# Patient Record
Sex: Female | Born: 1940 | Race: Black or African American | Hispanic: No
Health system: Southern US, Community
[De-identification: ages and names within clinical notes are randomized; demographics above are authoritative.]

## PROBLEM LIST (undated history)

## (undated) DIAGNOSIS — C801 Malignant (primary) neoplasm, unspecified: Secondary | ICD-10-CM

## (undated) DIAGNOSIS — K219 Gastro-esophageal reflux disease without esophagitis: Secondary | ICD-10-CM

## (undated) DIAGNOSIS — E7800 Pure hypercholesterolemia, unspecified: Secondary | ICD-10-CM

## (undated) DIAGNOSIS — K5730 Diverticulosis of large intestine without perforation or abscess without bleeding: Secondary | ICD-10-CM

## (undated) DIAGNOSIS — E119 Type 2 diabetes mellitus without complications: Secondary | ICD-10-CM

## (undated) DIAGNOSIS — H409 Unspecified glaucoma: Secondary | ICD-10-CM

## (undated) DIAGNOSIS — K859 Acute pancreatitis without necrosis or infection, unspecified: Secondary | ICD-10-CM

---

## 1998-04-11 ENCOUNTER — Emergency Department (HOSPITAL_COMMUNITY): Admission: EM | Admit: 1998-04-11 | Discharge: 1998-04-11 | Payer: Self-pay | Admitting: Emergency Medicine

## 1998-04-11 ENCOUNTER — Encounter: Payer: Self-pay | Admitting: Emergency Medicine

## 1998-05-02 ENCOUNTER — Encounter: Admission: RE | Admit: 1998-05-02 | Discharge: 1998-05-02 | Payer: Self-pay | Admitting: Internal Medicine

## 1998-10-13 ENCOUNTER — Encounter: Admission: RE | Admit: 1998-10-13 | Discharge: 1998-10-13 | Payer: Self-pay | Admitting: Internal Medicine

## 1999-03-20 ENCOUNTER — Emergency Department (HOSPITAL_COMMUNITY): Admission: EM | Admit: 1999-03-20 | Discharge: 1999-03-20 | Payer: Self-pay | Admitting: Emergency Medicine

## 1999-03-23 ENCOUNTER — Encounter: Admission: RE | Admit: 1999-03-23 | Discharge: 1999-03-23 | Payer: Self-pay | Admitting: Internal Medicine

## 1999-12-31 ENCOUNTER — Other Ambulatory Visit: Admission: RE | Admit: 1999-12-31 | Discharge: 1999-12-31 | Payer: Self-pay | Admitting: Family Medicine

## 2000-02-27 ENCOUNTER — Other Ambulatory Visit: Admission: RE | Admit: 2000-02-27 | Discharge: 2000-02-27 | Payer: Self-pay | Admitting: Radiology

## 2000-03-05 ENCOUNTER — Encounter (INDEPENDENT_AMBULATORY_CARE_PROVIDER_SITE_OTHER): Payer: Self-pay | Admitting: Specialist

## 2000-03-05 ENCOUNTER — Other Ambulatory Visit: Admission: RE | Admit: 2000-03-05 | Discharge: 2000-03-05 | Payer: Self-pay | Admitting: Radiology

## 2000-03-27 ENCOUNTER — Other Ambulatory Visit: Admission: RE | Admit: 2000-03-27 | Discharge: 2000-03-27 | Payer: Self-pay | Admitting: Surgery

## 2000-03-27 ENCOUNTER — Encounter (INDEPENDENT_AMBULATORY_CARE_PROVIDER_SITE_OTHER): Payer: Self-pay | Admitting: Specialist

## 2000-04-03 ENCOUNTER — Encounter: Admission: RE | Admit: 2000-04-03 | Discharge: 2000-07-02 | Payer: Self-pay | Admitting: Radiation Oncology

## 2001-04-08 ENCOUNTER — Other Ambulatory Visit: Admission: RE | Admit: 2001-04-08 | Discharge: 2001-04-08 | Payer: Self-pay | Admitting: Family Medicine

## 2006-10-26 ENCOUNTER — Emergency Department (HOSPITAL_COMMUNITY): Admission: EM | Admit: 2006-10-26 | Discharge: 2006-10-26 | Payer: Self-pay | Admitting: *Deleted

## 2008-06-28 ENCOUNTER — Ambulatory Visit: Payer: Self-pay | Admitting: Cardiovascular Disease

## 2008-06-28 ENCOUNTER — Ambulatory Visit: Payer: Self-pay | Admitting: Emergency Medicine

## 2008-06-28 ENCOUNTER — Inpatient Hospital Stay (HOSPITAL_COMMUNITY): Admission: EM | Admit: 2008-06-28 | Discharge: 2008-07-25 | Payer: Self-pay | Admitting: Emergency Medicine

## 2008-07-01 ENCOUNTER — Ambulatory Visit: Payer: Self-pay | Admitting: Gastroenterology

## 2008-07-05 ENCOUNTER — Encounter: Payer: Self-pay | Admitting: Pulmonary Disease

## 2008-07-09 ENCOUNTER — Encounter (INDEPENDENT_AMBULATORY_CARE_PROVIDER_SITE_OTHER): Payer: Self-pay | Admitting: Internal Medicine

## 2008-07-11 ENCOUNTER — Encounter (INDEPENDENT_AMBULATORY_CARE_PROVIDER_SITE_OTHER): Payer: Self-pay | Admitting: Internal Medicine

## 2008-07-11 ENCOUNTER — Ambulatory Visit: Payer: Self-pay | Admitting: Vascular Surgery

## 2008-07-21 ENCOUNTER — Ambulatory Visit: Payer: Self-pay | Admitting: Physical Medicine & Rehabilitation

## 2008-08-15 ENCOUNTER — Emergency Department (HOSPITAL_COMMUNITY): Admission: EM | Admit: 2008-08-15 | Discharge: 2008-08-15 | Payer: Self-pay | Admitting: Emergency Medicine

## 2008-08-28 ENCOUNTER — Inpatient Hospital Stay (HOSPITAL_COMMUNITY): Admission: EM | Admit: 2008-08-28 | Discharge: 2008-08-31 | Payer: Self-pay | Admitting: Emergency Medicine

## 2008-09-13 ENCOUNTER — Inpatient Hospital Stay (HOSPITAL_COMMUNITY): Admission: RE | Admit: 2008-09-13 | Discharge: 2008-09-16 | Payer: Self-pay | Admitting: General Surgery

## 2008-09-13 ENCOUNTER — Encounter (INDEPENDENT_AMBULATORY_CARE_PROVIDER_SITE_OTHER): Payer: Self-pay | Admitting: General Surgery

## 2008-09-26 ENCOUNTER — Encounter: Payer: Self-pay | Admitting: Gastroenterology

## 2008-10-02 ENCOUNTER — Inpatient Hospital Stay (HOSPITAL_COMMUNITY): Admission: EM | Admit: 2008-10-02 | Discharge: 2008-10-07 | Payer: Self-pay | Admitting: Emergency Medicine

## 2010-05-11 LAB — GLUCOSE, CAPILLARY
Glucose-Capillary: 122 mg/dL — ABNORMAL HIGH (ref 70–99)
Glucose-Capillary: 132 mg/dL — ABNORMAL HIGH (ref 70–99)
Glucose-Capillary: 134 mg/dL — ABNORMAL HIGH (ref 70–99)

## 2010-05-12 LAB — URINALYSIS, ROUTINE W REFLEX MICROSCOPIC
Leukocytes, UA: NEGATIVE
Nitrite: NEGATIVE
Specific Gravity, Urine: 1.012 (ref 1.005–1.030)
Urobilinogen, UA: 0.2 mg/dL (ref 0.0–1.0)
pH: 7 (ref 5.0–8.0)

## 2010-05-12 LAB — MAGNESIUM: Magnesium: 1.6 mg/dL (ref 1.5–2.5)

## 2010-05-12 LAB — COMPREHENSIVE METABOLIC PANEL
ALT: 13 U/L (ref 0–35)
AST: 20 U/L (ref 0–37)
Albumin: 2.8 g/dL — ABNORMAL LOW (ref 3.5–5.2)
Albumin: 4.1 g/dL (ref 3.5–5.2)
Alkaline Phosphatase: 53 U/L (ref 39–117)
BUN: 1 mg/dL — ABNORMAL LOW (ref 6–23)
BUN: 2 mg/dL — ABNORMAL LOW (ref 6–23)
BUN: 5 mg/dL — ABNORMAL LOW (ref 6–23)
CO2: 27 mEq/L (ref 19–32)
CO2: 31 mEq/L (ref 19–32)
Calcium: 8 mg/dL — ABNORMAL LOW (ref 8.4–10.5)
Calcium: 8.8 mg/dL (ref 8.4–10.5)
Calcium: 9.5 mg/dL (ref 8.4–10.5)
Chloride: 100 mEq/L (ref 96–112)
Chloride: 106 mEq/L (ref 96–112)
Creatinine, Ser: 0.57 mg/dL (ref 0.4–1.2)
Creatinine, Ser: 0.58 mg/dL (ref 0.4–1.2)
Creatinine, Ser: 0.59 mg/dL (ref 0.4–1.2)
GFR calc Af Amer: >60 mL/min (ref >60)
GFR calc Af Amer: >60 mL/min (ref >60)
GFR calc non Af Amer: >60 mL/min (ref >60)
GFR calc non Af Amer: >60 mL/min (ref >60)
GFR calc non Af Amer: >60 mL/min (ref >60)
Glucose, Bld: 103 mg/dL — ABNORMAL HIGH (ref 70–99)
Glucose, Bld: 105 mg/dL — ABNORMAL HIGH (ref 70–99)
Potassium: 3.5 mEq/L (ref 3.5–5.1)
Sodium: 144 mEq/L (ref 135–145)
Total Bilirubin: 0.3 mg/dL (ref 0.3–1.2)
Total Bilirubin: 0.7 mg/dL (ref 0.3–1.2)
Total Protein: 5.7 g/dL — ABNORMAL LOW (ref 6.0–8.3)
Total Protein: 5.9 g/dL — ABNORMAL LOW (ref 6.0–8.3)
Total Protein: 7.4 g/dL (ref 6.0–8.3)

## 2010-05-12 LAB — BASIC METABOLIC PANEL
BUN: 2 mg/dL — ABNORMAL LOW (ref 6–23)
BUN: 3 mg/dL — ABNORMAL LOW (ref 6–23)
Calcium: 8.4 mg/dL (ref 8.4–10.5)
Calcium: 8.6 mg/dL (ref 8.4–10.5)
Chloride: 107 mEq/L (ref 96–112)
Creatinine, Ser: 0.53 mg/dL (ref 0.4–1.2)
GFR calc Af Amer: >60 mL/min (ref >60)
GFR calc non Af Amer: >60 mL/min (ref >60)
GFR calc non Af Amer: >60 mL/min (ref >60)
Potassium: 3.4 mEq/L — ABNORMAL LOW (ref 3.5–5.1)

## 2010-05-12 LAB — CBC
HCT: 29.4 % — ABNORMAL LOW (ref 36.0–46.0)
HCT: 34.5 % — ABNORMAL LOW (ref 36.0–46.0)
HCT: 40 % (ref 36.0–46.0)
Hemoglobin: 10.4 g/dL — ABNORMAL LOW (ref 12.0–15.0)
Hemoglobin: 11.5 g/dL — ABNORMAL LOW (ref 12.0–15.0)
MCHC: 32.6 g/dL (ref 30.0–36.0)
MCHC: 33.2 g/dL (ref 30.0–36.0)
MCHC: 33.2 g/dL (ref 30.0–36.0)
MCV: 93 fL (ref 78.0–100.0)
MCV: 93 fL (ref 78.0–100.0)
MCV: 93 fL (ref 78.0–100.0)
Platelets: 198 10*3/uL (ref 150–400)
Platelets: 221 10*3/uL (ref 150–400)
Platelets: 228 10*3/uL (ref 150–400)
Platelets: 296 10*3/uL (ref 150–400)
RBC: 3.05 MIL/uL — ABNORMAL LOW (ref 3.87–5.11)
RBC: 3.45 MIL/uL — ABNORMAL LOW (ref 3.87–5.11)
RBC: 3.73 MIL/uL — ABNORMAL LOW (ref 3.87–5.11)
RDW: 14 % (ref 11.5–15.5)
RDW: 14.3 % (ref 11.5–15.5)
WBC: 5.2 10*3/uL (ref 4.0–10.5)
WBC: 5.8 10*3/uL (ref 4.0–10.5)
WBC: 6.8 10*3/uL (ref 4.0–10.5)
WBC: 7.9 10*3/uL (ref 4.0–10.5)

## 2010-05-12 LAB — GLUCOSE, CAPILLARY
Glucose-Capillary: 131 mg/dL — ABNORMAL HIGH (ref 70–99)
Glucose-Capillary: 73 mg/dL (ref 70–99)
Glucose-Capillary: 78 mg/dL (ref 70–99)

## 2010-05-12 LAB — DIFFERENTIAL
Basophils Absolute: 0 10*3/uL (ref 0.0–0.1)
Basophils Absolute: 0 10*3/uL (ref 0.0–0.1)
Basophils Relative: 0 % (ref 0–1)
Eosinophils Absolute: 0.1 10*3/uL (ref 0.0–0.7)
Lymphocytes Relative: 12 % (ref 12–46)
Lymphs Abs: 1 10*3/uL (ref 0.7–4.0)
Monocytes Absolute: 0.5 10*3/uL (ref 0.1–1.0)
Monocytes Absolute: 0.5 10*3/uL (ref 0.1–1.0)
Monocytes Relative: 8 % (ref 3–12)
Neutro Abs: 4.8 10*3/uL (ref 1.7–7.7)
Neutro Abs: 6.4 10*3/uL (ref 1.7–7.7)
Neutrophils Relative %: 71 % (ref 43–77)

## 2010-05-12 LAB — PHOSPHORUS: Phosphorus: 4.5 mg/dL (ref 2.3–4.6)

## 2010-05-12 LAB — LIPASE, BLOOD: Lipase: 95 U/L — ABNORMAL HIGH (ref 11–59)

## 2010-05-12 LAB — TYPE AND SCREEN

## 2010-05-12 LAB — URINE MICROSCOPIC-ADD ON

## 2010-05-13 LAB — COMPREHENSIVE METABOLIC PANEL
ALT: 12 U/L (ref 0–35)
ALT: 9 U/L (ref 0–35)
AST: 19 U/L (ref 0–37)
AST: 24 U/L (ref 0–37)
Albumin: 3.1 g/dL — ABNORMAL LOW (ref 3.5–5.2)
Alkaline Phosphatase: 61 U/L (ref 39–117)
CO2: 28 mEq/L (ref 19–32)
CO2: 29 mEq/L (ref 19–32)
Calcium: 9.6 mg/dL (ref 8.4–10.5)
Chloride: 108 mEq/L (ref 96–112)
Chloride: 110 mEq/L (ref 96–112)
GFR calc Af Amer: >60 mL/min (ref >60)
GFR calc Af Amer: >60 mL/min (ref >60)
GFR calc non Af Amer: >60 mL/min (ref >60)
GFR calc non Af Amer: >60 mL/min (ref >60)
Glucose, Bld: 119 mg/dL — ABNORMAL HIGH (ref 70–99)
Potassium: 3.2 mEq/L — ABNORMAL LOW (ref 3.5–5.1)
Potassium: 3.4 mEq/L — ABNORMAL LOW (ref 3.5–5.1)
Sodium: 144 mEq/L (ref 135–145)
Sodium: 144 mEq/L (ref 135–145)
Total Bilirubin: 0.6 mg/dL (ref 0.3–1.2)

## 2010-05-13 LAB — CK TOTAL AND CKMB (NOT AT ARMC)
CK, MB: 0.5 ng/mL (ref 0.3–4.0)
Relative Index: INVALID (ref 0.0–2.5)

## 2010-05-13 LAB — CBC
Hemoglobin: 11.1 g/dL — ABNORMAL LOW (ref 12.0–15.0)
Hemoglobin: 12.1 g/dL (ref 12.0–15.0)
MCHC: 33.1 g/dL (ref 30.0–36.0)
MCHC: 33.2 g/dL (ref 30.0–36.0)
Platelets: 223 10*3/uL (ref 150–400)
RBC: 1.87 MIL/uL — ABNORMAL LOW (ref 3.87–5.11)
RBC: 3.91 MIL/uL (ref 3.87–5.11)
RBC: 3.41 MIL/uL — ABNORMAL LOW (ref 3.87–5.11)
RDW: 14.2 % (ref 11.5–15.5)
WBC: 3.9 10*3/uL — ABNORMAL LOW (ref 4.0–10.5)
WBC: 7 10*3/uL (ref 4.0–10.5)
WBC: 7.3 10*3/uL (ref 4.0–10.5)

## 2010-05-13 LAB — BASIC METABOLIC PANEL
CO2: 29 mEq/L (ref 19–32)
Calcium: 8.9 mg/dL (ref 8.4–10.5)
GFR calc Af Amer: >60 mL/min (ref >60)
GFR calc non Af Amer: >60 mL/min (ref >60)
Sodium: 145 mEq/L (ref 135–145)

## 2010-05-13 LAB — CARDIAC PANEL(CRET KIN+CKTOT+MB+TROPI)
Relative Index: INVALID (ref 0.0–2.5)
Total CK: 21 U/L (ref 7–177)

## 2010-05-13 LAB — PHOSPHORUS: Phosphorus: 4.2 mg/dL (ref 2.3–4.6)

## 2010-05-13 LAB — TROPONIN I: Troponin I: <0.01 ng/mL (ref 0.00–0.06)

## 2010-05-13 LAB — DIFFERENTIAL
Basophils Absolute: 0 10*3/uL (ref 0.0–0.1)
Basophils Relative: 0 % (ref 0–1)
Eosinophils Absolute: 0.1 10*3/uL (ref 0.0–0.7)
Eosinophils Absolute: 0.2 10*3/uL (ref 0.0–0.7)
Eosinophils Relative: 2 % (ref 0–5)
Eosinophils Relative: 3 % (ref 0–5)
Lymphs Abs: 1.3 10*3/uL (ref 0.7–4.0)
Monocytes Absolute: 0.7 10*3/uL (ref 0.1–1.0)

## 2010-05-13 LAB — LIPASE, BLOOD
Lipase: 42 U/L (ref 11–59)
Lipase: 51 U/L (ref 11–59)
Lipase: 62 U/L — ABNORMAL HIGH (ref 11–59)
Lipase: 69 U/L — ABNORMAL HIGH (ref 11–59)

## 2010-05-13 LAB — MAGNESIUM: Magnesium: 1.8 mg/dL (ref 1.5–2.5)

## 2010-05-13 LAB — HEPATIC FUNCTION PANEL
Albumin: 1.9 g/dL — ABNORMAL LOW (ref 3.5–5.2)
Alkaline Phosphatase: 32 U/L — ABNORMAL LOW (ref 39–117)
Total Protein: 4.2 g/dL — ABNORMAL LOW (ref 6.0–8.3)

## 2010-05-13 LAB — CULTURE, BLOOD (ROUTINE X 2): Culture: NO GROWTH

## 2010-05-13 LAB — AMYLASE: Amylase: 178 U/L — ABNORMAL HIGH (ref 27–131)

## 2010-05-14 LAB — BASIC METABOLIC PANEL
BUN: 10 mg/dL (ref 6–23)
BUN: 11 mg/dL (ref 6–23)
CO2: 30 mEq/L (ref 19–32)
CO2: 32 mEq/L (ref 19–32)
Calcium: 7.6 mg/dL — ABNORMAL LOW (ref 8.4–10.5)
Calcium: 7.7 mg/dL — ABNORMAL LOW (ref 8.4–10.5)
Calcium: 7.9 mg/dL — ABNORMAL LOW (ref 8.4–10.5)
Chloride: 108 mEq/L (ref 96–112)
Creatinine, Ser: 0.55 mg/dL (ref 0.4–1.2)
Creatinine, Ser: 0.64 mg/dL (ref 0.4–1.2)
Creatinine, Ser: 0.67 mg/dL (ref 0.4–1.2)
GFR calc Af Amer: >60 mL/min (ref >60)
GFR calc Af Amer: >60 mL/min (ref >60)
GFR calc non Af Amer: >60 mL/min (ref >60)
GFR calc non Af Amer: >60 mL/min (ref >60)
Glucose, Bld: 173 mg/dL — ABNORMAL HIGH (ref 70–99)
Potassium: 4.2 mEq/L (ref 3.5–5.1)
Sodium: 145 mEq/L (ref 135–145)
Sodium: 146 mEq/L — ABNORMAL HIGH (ref 135–145)

## 2010-05-14 LAB — COMPREHENSIVE METABOLIC PANEL
ALT: 37 U/L — ABNORMAL HIGH (ref 0–35)
ALT: 45 U/L — ABNORMAL HIGH (ref 0–35)
ALT: 20 U/L (ref 0–35)
ALT: 37 U/L — ABNORMAL HIGH (ref 0–35)
ALT: 41 U/L — ABNORMAL HIGH (ref 0–35)
ALT: 42 U/L — ABNORMAL HIGH (ref 0–35)
ALT: 45 U/L — ABNORMAL HIGH (ref 0–35)
ALT: 62 U/L — ABNORMAL HIGH (ref 0–35)
AST: 42 U/L — ABNORMAL HIGH (ref 0–37)
AST: 44 U/L — ABNORMAL HIGH (ref 0–37)
AST: 53 U/L — ABNORMAL HIGH (ref 0–37)
AST: 41 U/L — ABNORMAL HIGH (ref 0–37)
AST: 42 U/L — ABNORMAL HIGH (ref 0–37)
AST: 47 U/L — ABNORMAL HIGH (ref 0–37)
AST: 49 U/L — ABNORMAL HIGH (ref 0–37)
AST: 64 U/L — ABNORMAL HIGH (ref 0–37)
AST: 71 U/L — ABNORMAL HIGH (ref 0–37)
Albumin: 1.5 g/dL — ABNORMAL LOW (ref 3.5–5.2)
Albumin: 1.8 g/dL — ABNORMAL LOW (ref 3.5–5.2)
Albumin: 1.9 g/dL — ABNORMAL LOW (ref 3.5–5.2)
Alkaline Phosphatase: 59 U/L (ref 39–117)
Alkaline Phosphatase: 62 U/L (ref 39–117)
Alkaline Phosphatase: 66 U/L (ref 39–117)
Alkaline Phosphatase: 55 U/L (ref 39–117)
Alkaline Phosphatase: 59 U/L (ref 39–117)
Alkaline Phosphatase: 79 U/L (ref 39–117)
Alkaline Phosphatase: 81 U/L (ref 39–117)
BUN: 1 mg/dL — ABNORMAL LOW (ref 6–23)
BUN: 3 mg/dL — ABNORMAL LOW (ref 6–23)
BUN: 3 mg/dL — ABNORMAL LOW (ref 6–23)
BUN: 3 mg/dL — ABNORMAL LOW (ref 6–23)
BUN: 16 mg/dL (ref 6–23)
BUN: 4 mg/dL — ABNORMAL LOW (ref 6–23)
BUN: 7 mg/dL (ref 6–23)
BUN: 8 mg/dL (ref 6–23)
BUN: 9 mg/dL (ref 6–23)
CO2: 30 mEq/L (ref 19–32)
CO2: 31 mEq/L (ref 19–32)
CO2: 32 mEq/L (ref 19–32)
CO2: 33 mEq/L — ABNORMAL HIGH (ref 19–32)
CO2: 34 mEq/L — ABNORMAL HIGH (ref 19–32)
CO2: 27 mEq/L (ref 19–32)
CO2: 28 mEq/L (ref 19–32)
CO2: 28 mEq/L (ref 19–32)
CO2: 30 mEq/L (ref 19–32)
CO2: 31 mEq/L (ref 19–32)
CO2: 32 mEq/L (ref 19–32)
CO2: 32 mEq/L (ref 19–32)
CO2: 33 mEq/L — ABNORMAL HIGH (ref 19–32)
CO2: 33 mEq/L — ABNORMAL HIGH (ref 19–32)
CO2: 33 mEq/L — ABNORMAL HIGH (ref 19–32)
Calcium: 8.8 mg/dL (ref 8.4–10.5)
Calcium: 9 mg/dL (ref 8.4–10.5)
Calcium: 7.3 mg/dL — ABNORMAL LOW (ref 8.4–10.5)
Calcium: 7.4 mg/dL — ABNORMAL LOW (ref 8.4–10.5)
Calcium: 8 mg/dL — ABNORMAL LOW (ref 8.4–10.5)
Calcium: 8.3 mg/dL — ABNORMAL LOW (ref 8.4–10.5)
Calcium: 8.4 mg/dL (ref 8.4–10.5)
Calcium: 8.6 mg/dL (ref 8.4–10.5)
Chloride: 106 mEq/L (ref 96–112)
Chloride: 107 mEq/L (ref 96–112)
Chloride: 111 mEq/L (ref 96–112)
Chloride: 103 mEq/L (ref 96–112)
Chloride: 104 mEq/L (ref 96–112)
Chloride: 104 mEq/L (ref 96–112)
Chloride: 109 mEq/L (ref 96–112)
Chloride: 112 mEq/L (ref 96–112)
Creatinine, Ser: 0.59 mg/dL (ref 0.4–1.2)
Creatinine, Ser: 0.62 mg/dL (ref 0.4–1.2)
Creatinine, Ser: 0.62 mg/dL (ref 0.4–1.2)
Creatinine, Ser: 0.68 mg/dL (ref 0.4–1.2)
Creatinine, Ser: 0.45 mg/dL (ref 0.4–1.2)
Creatinine, Ser: 0.46 mg/dL (ref 0.4–1.2)
Creatinine, Ser: 0.49 mg/dL (ref 0.4–1.2)
Creatinine, Ser: 0.5 mg/dL (ref 0.4–1.2)
Creatinine, Ser: 0.52 mg/dL (ref 0.4–1.2)
Creatinine, Ser: 0.55 mg/dL (ref 0.4–1.2)
Creatinine, Ser: 0.56 mg/dL (ref 0.4–1.2)
Creatinine, Ser: 0.58 mg/dL (ref 0.4–1.2)
GFR calc Af Amer: >60 mL/min (ref >60)
GFR calc Af Amer: >60 mL/min (ref >60)
GFR calc Af Amer: >60 mL/min (ref >60)
GFR calc Af Amer: >60 mL/min (ref >60)
GFR calc Af Amer: >60 mL/min (ref >60)
GFR calc Af Amer: >60 mL/min (ref >60)
GFR calc Af Amer: >60 mL/min (ref >60)
GFR calc Af Amer: >60 mL/min (ref >60)
GFR calc non Af Amer: >60 mL/min (ref >60)
GFR calc non Af Amer: >60 mL/min (ref >60)
GFR calc non Af Amer: >60 mL/min (ref >60)
GFR calc non Af Amer: >60 mL/min (ref >60)
GFR calc non Af Amer: >60 mL/min (ref >60)
GFR calc non Af Amer: 36 mL/min — ABNORMAL LOW (ref >60)
GFR calc non Af Amer: >60 mL/min (ref >60)
GFR calc non Af Amer: >60 mL/min (ref >60)
GFR calc non Af Amer: >60 mL/min (ref >60)
GFR calc non Af Amer: >60 mL/min (ref >60)
GFR calc non Af Amer: >60 mL/min (ref >60)
GFR calc non Af Amer: >60 mL/min (ref >60)
GFR calc non Af Amer: >60 mL/min (ref >60)
GFR calc non Af Amer: >60 mL/min (ref >60)
GFR calc non Af Amer: >60 mL/min (ref >60)
Glucose, Bld: 87 mg/dL (ref 70–99)
Glucose, Bld: 110 mg/dL — ABNORMAL HIGH (ref 70–99)
Glucose, Bld: 165 mg/dL — ABNORMAL HIGH (ref 70–99)
Glucose, Bld: 175 mg/dL — ABNORMAL HIGH (ref 70–99)
Glucose, Bld: 182 mg/dL — ABNORMAL HIGH (ref 70–99)
Glucose, Bld: 270 mg/dL — ABNORMAL HIGH (ref 70–99)
Glucose, Bld: 93 mg/dL (ref 70–99)
Potassium: 3.4 mEq/L — ABNORMAL LOW (ref 3.5–5.1)
Potassium: 3.5 mEq/L (ref 3.5–5.1)
Potassium: 3.8 mEq/L (ref 3.5–5.1)
Potassium: 3.6 mEq/L (ref 3.5–5.1)
Potassium: 3.8 mEq/L (ref 3.5–5.1)
Potassium: 4 mEq/L (ref 3.5–5.1)
Sodium: 138 mEq/L (ref 135–145)
Sodium: 139 mEq/L (ref 135–145)
Sodium: 139 mEq/L (ref 135–145)
Sodium: 140 mEq/L (ref 135–145)
Sodium: 144 mEq/L (ref 135–145)
Sodium: 144 mEq/L (ref 135–145)
Total Bilirubin: 0.6 mg/dL (ref 0.3–1.2)
Total Bilirubin: 0.6 mg/dL (ref 0.3–1.2)
Total Bilirubin: 0.7 mg/dL (ref 0.3–1.2)
Total Bilirubin: 0.8 mg/dL (ref 0.3–1.2)
Total Bilirubin: 0.6 mg/dL (ref 0.3–1.2)
Total Bilirubin: 0.7 mg/dL (ref 0.3–1.2)
Total Bilirubin: 0.9 mg/dL (ref 0.3–1.2)
Total Bilirubin: 1.1 mg/dL (ref 0.3–1.2)
Total Protein: 6.4 g/dL (ref 6.0–8.3)
Total Protein: 4.8 g/dL — ABNORMAL LOW (ref 6.0–8.3)
Total Protein: 5 g/dL — ABNORMAL LOW (ref 6.0–8.3)
Total Protein: 5.8 g/dL — ABNORMAL LOW (ref 6.0–8.3)
Total Protein: 6 g/dL (ref 6.0–8.3)

## 2010-05-14 LAB — GLUCOSE, CAPILLARY
Glucose-Capillary: 117 mg/dL — ABNORMAL HIGH (ref 70–99)
Glucose-Capillary: 119 mg/dL — ABNORMAL HIGH (ref 70–99)
Glucose-Capillary: 130 mg/dL — ABNORMAL HIGH (ref 70–99)
Glucose-Capillary: 130 mg/dL — ABNORMAL HIGH (ref 70–99)
Glucose-Capillary: 171 mg/dL — ABNORMAL HIGH (ref 70–99)
Glucose-Capillary: 77 mg/dL (ref 70–99)
Glucose-Capillary: 82 mg/dL (ref 70–99)
Glucose-Capillary: 85 mg/dL (ref 70–99)
Glucose-Capillary: 89 mg/dL (ref 70–99)
Glucose-Capillary: 96 mg/dL (ref 70–99)
Glucose-Capillary: 97 mg/dL (ref 70–99)
Glucose-Capillary: 100 mg/dL — ABNORMAL HIGH (ref 70–99)
Glucose-Capillary: 106 mg/dL — ABNORMAL HIGH (ref 70–99)
Glucose-Capillary: 107 mg/dL — ABNORMAL HIGH (ref 70–99)
Glucose-Capillary: 107 mg/dL — ABNORMAL HIGH (ref 70–99)
Glucose-Capillary: 113 mg/dL — ABNORMAL HIGH (ref 70–99)
Glucose-Capillary: 122 mg/dL — ABNORMAL HIGH (ref 70–99)
Glucose-Capillary: 123 mg/dL — ABNORMAL HIGH (ref 70–99)
Glucose-Capillary: 124 mg/dL — ABNORMAL HIGH (ref 70–99)
Glucose-Capillary: 125 mg/dL — ABNORMAL HIGH (ref 70–99)
Glucose-Capillary: 125 mg/dL — ABNORMAL HIGH (ref 70–99)
Glucose-Capillary: 126 mg/dL — ABNORMAL HIGH (ref 70–99)
Glucose-Capillary: 139 mg/dL — ABNORMAL HIGH (ref 70–99)
Glucose-Capillary: 140 mg/dL — ABNORMAL HIGH (ref 70–99)
Glucose-Capillary: 141 mg/dL — ABNORMAL HIGH (ref 70–99)
Glucose-Capillary: 144 mg/dL — ABNORMAL HIGH (ref 70–99)
Glucose-Capillary: 144 mg/dL — ABNORMAL HIGH (ref 70–99)
Glucose-Capillary: 145 mg/dL — ABNORMAL HIGH (ref 70–99)
Glucose-Capillary: 146 mg/dL — ABNORMAL HIGH (ref 70–99)
Glucose-Capillary: 148 mg/dL — ABNORMAL HIGH (ref 70–99)
Glucose-Capillary: 150 mg/dL — ABNORMAL HIGH (ref 70–99)
Glucose-Capillary: 150 mg/dL — ABNORMAL HIGH (ref 70–99)
Glucose-Capillary: 151 mg/dL — ABNORMAL HIGH (ref 70–99)
Glucose-Capillary: 153 mg/dL — ABNORMAL HIGH (ref 70–99)
Glucose-Capillary: 154 mg/dL — ABNORMAL HIGH (ref 70–99)
Glucose-Capillary: 154 mg/dL — ABNORMAL HIGH (ref 70–99)
Glucose-Capillary: 154 mg/dL — ABNORMAL HIGH (ref 70–99)
Glucose-Capillary: 157 mg/dL — ABNORMAL HIGH (ref 70–99)
Glucose-Capillary: 157 mg/dL — ABNORMAL HIGH (ref 70–99)
Glucose-Capillary: 159 mg/dL — ABNORMAL HIGH (ref 70–99)
Glucose-Capillary: 161 mg/dL — ABNORMAL HIGH (ref 70–99)
Glucose-Capillary: 163 mg/dL — ABNORMAL HIGH (ref 70–99)
Glucose-Capillary: 166 mg/dL — ABNORMAL HIGH (ref 70–99)
Glucose-Capillary: 167 mg/dL — ABNORMAL HIGH (ref 70–99)
Glucose-Capillary: 170 mg/dL — ABNORMAL HIGH (ref 70–99)
Glucose-Capillary: 182 mg/dL — ABNORMAL HIGH (ref 70–99)
Glucose-Capillary: 191 mg/dL — ABNORMAL HIGH (ref 70–99)
Glucose-Capillary: 191 mg/dL — ABNORMAL HIGH (ref 70–99)
Glucose-Capillary: 200 mg/dL — ABNORMAL HIGH (ref 70–99)
Glucose-Capillary: 212 mg/dL — ABNORMAL HIGH (ref 70–99)
Glucose-Capillary: 219 mg/dL — ABNORMAL HIGH (ref 70–99)
Glucose-Capillary: 227 mg/dL — ABNORMAL HIGH (ref 70–99)
Glucose-Capillary: 244 mg/dL — ABNORMAL HIGH (ref 70–99)
Glucose-Capillary: 254 mg/dL — ABNORMAL HIGH (ref 70–99)
Glucose-Capillary: 275 mg/dL — ABNORMAL HIGH (ref 70–99)
Glucose-Capillary: 87 mg/dL (ref 70–99)
Glucose-Capillary: 88 mg/dL (ref 70–99)
Glucose-Capillary: 91 mg/dL (ref 70–99)
Glucose-Capillary: 91 mg/dL (ref 70–99)

## 2010-05-14 LAB — CBC
HCT: 27.7 % — ABNORMAL LOW (ref 36.0–46.0)
HCT: 29.7 % — ABNORMAL LOW (ref 36.0–46.0)
HCT: 29.8 % — ABNORMAL LOW (ref 36.0–46.0)
HCT: 25.9 % — ABNORMAL LOW (ref 36.0–46.0)
HCT: 26.8 % — ABNORMAL LOW (ref 36.0–46.0)
HCT: 27.2 % — ABNORMAL LOW (ref 36.0–46.0)
HCT: 28 % — ABNORMAL LOW (ref 36.0–46.0)
HCT: 30.4 % — ABNORMAL LOW (ref 36.0–46.0)
Hemoglobin: 9.9 g/dL — ABNORMAL LOW (ref 12.0–15.0)
Hemoglobin: 10.4 g/dL — ABNORMAL LOW (ref 12.0–15.0)
Hemoglobin: 8.5 g/dL — ABNORMAL LOW (ref 12.0–15.0)
Hemoglobin: 8.7 g/dL — ABNORMAL LOW (ref 12.0–15.0)
Hemoglobin: 8.8 g/dL — ABNORMAL LOW (ref 12.0–15.0)
Hemoglobin: 8.9 g/dL — ABNORMAL LOW (ref 12.0–15.0)
Hemoglobin: 8.9 g/dL — ABNORMAL LOW (ref 12.0–15.0)
Hemoglobin: 9.1 g/dL — ABNORMAL LOW (ref 12.0–15.0)
Hemoglobin: 9.4 g/dL — ABNORMAL LOW (ref 12.0–15.0)
MCHC: 32.9 g/dL (ref 30.0–36.0)
MCHC: 33.3 g/dL (ref 30.0–36.0)
MCHC: 33.3 g/dL (ref 30.0–36.0)
MCHC: 33.4 g/dL (ref 30.0–36.0)
MCHC: 33.4 g/dL (ref 30.0–36.0)
MCHC: 33.5 g/dL (ref 30.0–36.0)
MCHC: 33.6 g/dL (ref 30.0–36.0)
MCHC: 33.6 g/dL (ref 30.0–36.0)
MCHC: 34 g/dL (ref 30.0–36.0)
MCHC: 34 g/dL (ref 30.0–36.0)
MCV: 93.8 fL (ref 78.0–100.0)
MCV: 93.9 fL (ref 78.0–100.0)
MCV: 93.9 fL (ref 78.0–100.0)
MCV: 94.4 fL (ref 78.0–100.0)
MCV: 91.8 fL (ref 78.0–100.0)
MCV: 92.1 fL (ref 78.0–100.0)
MCV: 92.8 fL (ref 78.0–100.0)
MCV: 93 fL (ref 78.0–100.0)
MCV: 93.5 fL (ref 78.0–100.0)
MCV: 94.7 fL (ref 78.0–100.0)
MCV: 95.5 fL (ref 78.0–100.0)
MCV: 95.8 fL (ref 78.0–100.0)
MCV: 96.5 fL (ref 78.0–100.0)
Platelets: 217 10*3/uL (ref 150–400)
Platelets: 256 10*3/uL (ref 150–400)
Platelets: 342 10*3/uL (ref 150–400)
Platelets: 360 10*3/uL (ref 150–400)
Platelets: 386 10*3/uL (ref 150–400)
RBC: 2.95 MIL/uL — ABNORMAL LOW (ref 3.87–5.11)
RBC: 2.98 MIL/uL — ABNORMAL LOW (ref 3.87–5.11)
RBC: 3.15 MIL/uL — ABNORMAL LOW (ref 3.87–5.11)
RBC: 3.17 MIL/uL — ABNORMAL LOW (ref 3.87–5.11)
RBC: 2.63 MIL/uL — ABNORMAL LOW (ref 3.87–5.11)
RBC: 2.71 MIL/uL — ABNORMAL LOW (ref 3.87–5.11)
RBC: 2.71 MIL/uL — ABNORMAL LOW (ref 3.87–5.11)
RBC: 2.87 MIL/uL — ABNORMAL LOW (ref 3.87–5.11)
RBC: 2.89 MIL/uL — ABNORMAL LOW (ref 3.87–5.11)
RBC: 2.97 MIL/uL — ABNORMAL LOW (ref 3.87–5.11)
RBC: 2.98 MIL/uL — ABNORMAL LOW (ref 3.87–5.11)
RBC: 3 MIL/uL — ABNORMAL LOW (ref 3.87–5.11)
RBC: 3.17 MIL/uL — ABNORMAL LOW (ref 3.87–5.11)
RBC: 3.23 MIL/uL — ABNORMAL LOW (ref 3.87–5.11)
RBC: 3.3 MIL/uL — ABNORMAL LOW (ref 3.87–5.11)
RDW: 13.6 % (ref 11.5–15.5)
RDW: 14.8 % (ref 11.5–15.5)
RDW: 14.8 % (ref 11.5–15.5)
RDW: 14.9 % (ref 11.5–15.5)
RDW: 15.1 % (ref 11.5–15.5)
RDW: 15.4 % (ref 11.5–15.5)
WBC: 7 10*3/uL (ref 4.0–10.5)
WBC: 7 10*3/uL (ref 4.0–10.5)
WBC: 8.1 10*3/uL (ref 4.0–10.5)
WBC: 11.4 10*3/uL — ABNORMAL HIGH (ref 4.0–10.5)
WBC: 11.8 10*3/uL — ABNORMAL HIGH (ref 4.0–10.5)
WBC: 16 10*3/uL — ABNORMAL HIGH (ref 4.0–10.5)
WBC: 17.9 10*3/uL — ABNORMAL HIGH (ref 4.0–10.5)
WBC: 18 10*3/uL — ABNORMAL HIGH (ref 4.0–10.5)
WBC: 18.1 10*3/uL — ABNORMAL HIGH (ref 4.0–10.5)
WBC: 9.7 10*3/uL (ref 4.0–10.5)

## 2010-05-14 LAB — LIPASE, BLOOD
Lipase: 183 U/L — ABNORMAL HIGH (ref 11–59)
Lipase: 243 U/L — ABNORMAL HIGH (ref 11–59)
Lipase: 101 U/L — ABNORMAL HIGH (ref 11–59)
Lipase: 158 U/L — ABNORMAL HIGH (ref 11–59)
Lipase: 24 U/L (ref 11–59)
Lipase: 29 U/L (ref 11–59)
Lipase: 35 U/L (ref 11–59)
Lipase: 63 U/L — ABNORMAL HIGH (ref 11–59)
Lipase: 64 U/L — ABNORMAL HIGH (ref 11–59)

## 2010-05-14 LAB — CROSSMATCH
ABO/RH(D): A POS
ABO/RH(D): A POS
Antibody Screen: NEGATIVE
Antibody Screen: NEGATIVE

## 2010-05-14 LAB — AMYLASE, BODY FLUID

## 2010-05-14 LAB — BODY FLUID CELL COUNT WITH DIFFERENTIAL
Eos, Fluid: 2 %
Lymphs, Fluid: 11 %
Monocyte-Macrophage-Serous Fluid: 49 % — ABNORMAL LOW (ref 50–90)
Total Nucleated Cell Count, Fluid: 1170 cu mm — ABNORMAL HIGH (ref 0–1000)

## 2010-05-14 LAB — POCT I-STAT 3, ART BLOOD GAS (G3+)
O2 Saturation: 96 %
Patient temperature: 99.6
TCO2: 29 mmol/L (ref 0–100)
TCO2: 31 mmol/L (ref 0–100)
pCO2 arterial: 37.2 mmHg (ref 35.0–45.0)
pCO2 arterial: 38.9 mmHg (ref 35.0–45.0)
pCO2 arterial: 40.9 mmHg (ref 35.0–45.0)
pH, Arterial: 7.474 — ABNORMAL HIGH (ref 7.350–7.400)
pH, Arterial: 7.481 — ABNORMAL HIGH (ref 7.350–7.400)
pH, Arterial: 7.488 — ABNORMAL HIGH (ref 7.350–7.400)
pO2, Arterial: 73 mmHg — ABNORMAL LOW (ref 80.0–100.0)
pO2, Arterial: 86 mmHg (ref 80.0–100.0)

## 2010-05-14 LAB — TRIGLYCERIDES: Triglycerides: 140 mg/dL (ref <150)

## 2010-05-14 LAB — CULTURE, BLOOD (ROUTINE X 2): Culture: NO GROWTH

## 2010-05-14 LAB — MAGNESIUM
Magnesium: 2 mg/dL (ref 1.5–2.5)
Magnesium: 2 mg/dL (ref 1.5–2.5)
Magnesium: 2.1 mg/dL (ref 1.5–2.5)
Magnesium: 2.1 mg/dL (ref 1.5–2.5)
Magnesium: 2.2 mg/dL (ref 1.5–2.5)

## 2010-05-14 LAB — DIFFERENTIAL
Basophils Absolute: 0 10*3/uL (ref 0.0–0.1)
Basophils Relative: 0 % (ref 0–1)
Lymphocytes Relative: 3 % — ABNORMAL LOW (ref 12–46)
Neutro Abs: 15.2 10*3/uL — ABNORMAL HIGH (ref 1.7–7.7)

## 2010-05-14 LAB — PHOSPHORUS
Phosphorus: 2.4 mg/dL (ref 2.3–4.6)
Phosphorus: 2.4 mg/dL (ref 2.3–4.6)
Phosphorus: 2.7 mg/dL (ref 2.3–4.6)
Phosphorus: 3.3 mg/dL (ref 2.3–4.6)
Phosphorus: 3.6 mg/dL (ref 2.3–4.6)

## 2010-05-14 LAB — PROTEIN, BODY FLUID: Total protein, fluid: <3 g/dL

## 2010-05-14 LAB — BODY FLUID CULTURE

## 2010-05-15 LAB — POCT I-STAT 3, ART BLOOD GAS (G3+)
Acid-Base Excess: 4 mmol/L — ABNORMAL HIGH (ref 0.0–2.0)
Acid-base deficit: 4 mmol/L — ABNORMAL HIGH (ref 0.0–2.0)
Acid-base deficit: 5 mmol/L — ABNORMAL HIGH (ref 0.0–2.0)
Bicarbonate: 20 mEq/L (ref 20.0–24.0)
Bicarbonate: 20.1 mEq/L (ref 20.0–24.0)
Bicarbonate: 20.1 mEq/L (ref 20.0–24.0)
Bicarbonate: 21 mEq/L (ref 20.0–24.0)
Bicarbonate: 24.6 mEq/L — ABNORMAL HIGH (ref 20.0–24.0)
O2 Saturation: 100 %
O2 Saturation: 87 %
O2 Saturation: 96 %
O2 Saturation: 97 %
O2 Saturation: 98 %
Patient temperature: 99.7
TCO2: 22 mmol/L (ref 0–100)
TCO2: 22 mmol/L (ref 0–100)
TCO2: 26 mmol/L (ref 0–100)
pCO2 arterial: 36.5 mmHg (ref 35.0–45.0)
pCO2 arterial: 37.9 mmHg (ref 35.0–45.0)
pCO2 arterial: 38.8 mmHg (ref 35.0–45.0)
pCO2 arterial: 39.8 mmHg (ref 35.0–45.0)
pH, Arterial: 7.346 — ABNORMAL LOW (ref 7.350–7.400)
pH, Arterial: 7.401 — ABNORMAL HIGH (ref 7.350–7.400)
pO2, Arterial: 106 mmHg — ABNORMAL HIGH (ref 80.0–100.0)
pO2, Arterial: 55 mmHg — ABNORMAL LOW (ref 80.0–100.0)
pO2, Arterial: 88 mmHg (ref 80.0–100.0)
pO2, Arterial: 96 mmHg (ref 80.0–100.0)

## 2010-05-15 LAB — CBC
HCT: 27.5 % — ABNORMAL LOW (ref 36.0–46.0)
HCT: 38.2 % (ref 36.0–46.0)
HCT: 46.3 % — ABNORMAL HIGH (ref 36.0–46.0)
Hemoglobin: 11 g/dL — ABNORMAL LOW (ref 12.0–15.0)
Hemoglobin: 12.8 g/dL (ref 12.0–15.0)
Hemoglobin: 14.4 g/dL (ref 12.0–15.0)
Hemoglobin: 9.3 g/dL — ABNORMAL LOW (ref 12.0–15.0)
MCHC: 33.6 g/dL (ref 30.0–36.0)
MCHC: 34 g/dL (ref 30.0–36.0)
MCV: 94.8 fL (ref 78.0–100.0)
MCV: 94.8 fL (ref 78.0–100.0)
Platelets: 171 10*3/uL (ref 150–400)
Platelets: 176 10*3/uL (ref 150–400)
Platelets: 197 10*3/uL (ref 150–400)
Platelets: 240 10*3/uL (ref 150–400)
RBC: 3.02 MIL/uL — ABNORMAL LOW (ref 3.87–5.11)
RBC: 3.38 MIL/uL — ABNORMAL LOW (ref 3.87–5.11)
RBC: 4.54 MIL/uL (ref 3.87–5.11)
RBC: 4.89 MIL/uL (ref 3.87–5.11)
RDW: 12.8 % (ref 11.5–15.5)
RDW: 12.9 % (ref 11.5–15.5)
RDW: 13.1 % (ref 11.5–15.5)
RDW: 13.9 % (ref 11.5–15.5)
WBC: 10.6 10*3/uL — ABNORMAL HIGH (ref 4.0–10.5)
WBC: 10.9 10*3/uL — ABNORMAL HIGH (ref 4.0–10.5)
WBC: 11.1 10*3/uL — ABNORMAL HIGH (ref 4.0–10.5)
WBC: 11.9 10*3/uL — ABNORMAL HIGH (ref 4.0–10.5)
WBC: 14.7 10*3/uL — ABNORMAL HIGH (ref 4.0–10.5)
WBC: 17.3 10*3/uL — ABNORMAL HIGH (ref 4.0–10.5)
WBC: 9.3 10*3/uL (ref 4.0–10.5)

## 2010-05-15 LAB — GLUCOSE, CAPILLARY
Glucose-Capillary: 112 mg/dL — ABNORMAL HIGH (ref 70–99)
Glucose-Capillary: 116 mg/dL — ABNORMAL HIGH (ref 70–99)
Glucose-Capillary: 116 mg/dL — ABNORMAL HIGH (ref 70–99)
Glucose-Capillary: 126 mg/dL — ABNORMAL HIGH (ref 70–99)
Glucose-Capillary: 126 mg/dL — ABNORMAL HIGH (ref 70–99)
Glucose-Capillary: 134 mg/dL — ABNORMAL HIGH (ref 70–99)
Glucose-Capillary: 137 mg/dL — ABNORMAL HIGH (ref 70–99)
Glucose-Capillary: 154 mg/dL — ABNORMAL HIGH (ref 70–99)
Glucose-Capillary: 168 mg/dL — ABNORMAL HIGH (ref 70–99)
Glucose-Capillary: 99 mg/dL (ref 70–99)

## 2010-05-15 LAB — CULTURE, BLOOD (ROUTINE X 2): Culture: NO GROWTH

## 2010-05-15 LAB — BASIC METABOLIC PANEL
BUN: 10 mg/dL (ref 6–23)
BUN: 11 mg/dL (ref 6–23)
BUN: 22 mg/dL (ref 6–23)
BUN: 23 mg/dL (ref 6–23)
BUN: 7 mg/dL (ref 6–23)
BUN: 7 mg/dL (ref 6–23)
BUN: 8 mg/dL (ref 6–23)
CO2: 22 mEq/L (ref 19–32)
CO2: 22 mEq/L (ref 19–32)
CO2: 23 mEq/L (ref 19–32)
CO2: 26 mEq/L (ref 19–32)
CO2: 26 mEq/L (ref 19–32)
CO2: 29 mEq/L (ref 19–32)
Calcium: 6.9 mg/dL — ABNORMAL LOW (ref 8.4–10.5)
Calcium: 6.9 mg/dL — ABNORMAL LOW (ref 8.4–10.5)
Calcium: 7.1 mg/dL — ABNORMAL LOW (ref 8.4–10.5)
Calcium: 7.2 mg/dL — ABNORMAL LOW (ref 8.4–10.5)
Calcium: 7.3 mg/dL — ABNORMAL LOW (ref 8.4–10.5)
Calcium: 7.4 mg/dL — ABNORMAL LOW (ref 8.4–10.5)
Calcium: 7.4 mg/dL — ABNORMAL LOW (ref 8.4–10.5)
Chloride: 116 mEq/L — ABNORMAL HIGH (ref 96–112)
Chloride: 116 mEq/L — ABNORMAL HIGH (ref 96–112)
Chloride: 121 mEq/L — ABNORMAL HIGH (ref 96–112)
Chloride: 123 mEq/L — ABNORMAL HIGH (ref 96–112)
Chloride: 124 mEq/L — ABNORMAL HIGH (ref 96–112)
Chloride: 124 mEq/L — ABNORMAL HIGH (ref 96–112)
Chloride: 125 mEq/L — ABNORMAL HIGH (ref 96–112)
Chloride: 126 mEq/L — ABNORMAL HIGH (ref 96–112)
Creatinine, Ser: 0.56 mg/dL (ref 0.4–1.2)
Creatinine, Ser: 0.56 mg/dL (ref 0.4–1.2)
Creatinine, Ser: 0.62 mg/dL (ref 0.4–1.2)
Creatinine, Ser: 0.7 mg/dL (ref 0.4–1.2)
Creatinine, Ser: 1.38 mg/dL — ABNORMAL HIGH (ref 0.4–1.2)
GFR calc Af Amer: 30 mL/min — ABNORMAL LOW (ref >60)
GFR calc Af Amer: >60 mL/min (ref >60)
GFR calc Af Amer: >60 mL/min (ref >60)
GFR calc Af Amer: >60 mL/min (ref >60)
GFR calc Af Amer: >60 mL/min (ref >60)
GFR calc non Af Amer: 21 mL/min — ABNORMAL LOW (ref >60)
GFR calc non Af Amer: 25 mL/min — ABNORMAL LOW (ref >60)
GFR calc non Af Amer: >60 mL/min (ref >60)
GFR calc non Af Amer: >60 mL/min (ref >60)
GFR calc non Af Amer: >60 mL/min (ref >60)
GFR calc non Af Amer: >60 mL/min (ref >60)
Glucose, Bld: 111 mg/dL — ABNORMAL HIGH (ref 70–99)
Glucose, Bld: 143 mg/dL — ABNORMAL HIGH (ref 70–99)
Glucose, Bld: 144 mg/dL — ABNORMAL HIGH (ref 70–99)
Glucose, Bld: 147 mg/dL — ABNORMAL HIGH (ref 70–99)
Glucose, Bld: 151 mg/dL — ABNORMAL HIGH (ref 70–99)
Glucose, Bld: 169 mg/dL — ABNORMAL HIGH (ref 70–99)
Glucose, Bld: 171 mg/dL — ABNORMAL HIGH (ref 70–99)
Glucose, Bld: 188 mg/dL — ABNORMAL HIGH (ref 70–99)
Glucose, Bld: 195 mg/dL — ABNORMAL HIGH (ref 70–99)
Glucose, Bld: 207 mg/dL — ABNORMAL HIGH (ref 70–99)
Potassium: 3.6 mEq/L (ref 3.5–5.1)
Potassium: 3.7 mEq/L (ref 3.5–5.1)
Potassium: 3.9 mEq/L (ref 3.5–5.1)
Potassium: 4.1 mEq/L (ref 3.5–5.1)
Potassium: 4.6 mEq/L (ref 3.5–5.1)
Sodium: 148 mEq/L — ABNORMAL HIGH (ref 135–145)
Sodium: 149 mEq/L — ABNORMAL HIGH (ref 135–145)
Sodium: 150 mEq/L — ABNORMAL HIGH (ref 135–145)
Sodium: 153 mEq/L — ABNORMAL HIGH (ref 135–145)
Sodium: 154 mEq/L — ABNORMAL HIGH (ref 135–145)
Sodium: 155 mEq/L — ABNORMAL HIGH (ref 135–145)
Sodium: 156 mEq/L — ABNORMAL HIGH (ref 135–145)

## 2010-05-15 LAB — POCT I-STAT, CHEM 8
BUN: 19 mg/dL (ref 6–23)
Calcium, Ion: 1.05 mmol/L — ABNORMAL LOW (ref 1.12–1.32)
Chloride: 108 mEq/L (ref 96–112)
Glucose, Bld: 216 mg/dL — ABNORMAL HIGH (ref 70–99)
TCO2: 25 mmol/L (ref 0–100)

## 2010-05-15 LAB — POCT I-STAT 7, (LYTES, BLD GAS, ICA,H+H)
Acid-Base Excess: 4 mmol/L — ABNORMAL HIGH (ref 0.0–2.0)
Bicarbonate: 28 mEq/L — ABNORMAL HIGH (ref 20.0–24.0)
Calcium, Ion: 1.14 mmol/L (ref 1.12–1.32)
HCT: 27 % — ABNORMAL LOW (ref 36.0–46.0)
Sodium: 152 mEq/L — ABNORMAL HIGH (ref 135–145)

## 2010-05-15 LAB — LIPID PANEL
HDL: 38 mg/dL — ABNORMAL LOW (ref >39)
Total CHOL/HDL Ratio: 4.4 RATIO
VLDL: 15 mg/dL (ref 0–40)

## 2010-05-15 LAB — COMPREHENSIVE METABOLIC PANEL
ALT: 23 U/L (ref 0–35)
AST: 36 U/L (ref 0–37)
AST: 40 U/L — ABNORMAL HIGH (ref 0–37)
Albumin: 1.7 g/dL — ABNORMAL LOW (ref 3.5–5.2)
Albumin: 2.3 g/dL — ABNORMAL LOW (ref 3.5–5.2)
Alkaline Phosphatase: 48 U/L (ref 39–117)
Alkaline Phosphatase: 61 U/L (ref 39–117)
BUN: 7 mg/dL (ref 6–23)
CO2: 19 mEq/L (ref 19–32)
CO2: 27 mEq/L (ref 19–32)
Calcium: 7.2 mg/dL — ABNORMAL LOW (ref 8.4–10.5)
Chloride: 123 mEq/L — ABNORMAL HIGH (ref 96–112)
Chloride: 123 mEq/L — ABNORMAL HIGH (ref 96–112)
Chloride: 125 mEq/L — ABNORMAL HIGH (ref 96–112)
Creatinine, Ser: 0.63 mg/dL (ref 0.4–1.2)
GFR calc Af Amer: 58 mL/min — ABNORMAL LOW (ref >60)
GFR calc Af Amer: >60 mL/min (ref >60)
GFR calc non Af Amer: >60 mL/min (ref >60)
GFR calc non Af Amer: >60 mL/min (ref >60)
Glucose, Bld: 183 mg/dL — ABNORMAL HIGH (ref 70–99)
Potassium: 3.1 mEq/L — ABNORMAL LOW (ref 3.5–5.1)
Potassium: 4.1 mEq/L (ref 3.5–5.1)
Sodium: 153 mEq/L — ABNORMAL HIGH (ref 135–145)
Sodium: 153 mEq/L — ABNORMAL HIGH (ref 135–145)
Total Bilirubin: 1.1 mg/dL (ref 0.3–1.2)
Total Bilirubin: 1.2 mg/dL (ref 0.3–1.2)

## 2010-05-15 LAB — CALCIUM, IONIZED
Calcium, Ion: 1.13 mmol/L (ref 1.12–1.32)
Calcium, Ion: 1.14 mmol/L (ref 1.12–1.32)
Calcium, Ion: 1.2 mmol/L (ref 1.12–1.32)

## 2010-05-15 LAB — PROTIME-INR: INR: 1.5 (ref 0.00–1.49)

## 2010-05-15 LAB — MAGNESIUM
Magnesium: 2.3 mg/dL (ref 1.5–2.5)
Magnesium: 2.7 mg/dL — ABNORMAL HIGH (ref 1.5–2.5)

## 2010-05-15 LAB — RETICULOCYTES
RBC.: 3.04 MIL/uL — ABNORMAL LOW (ref 3.87–5.11)
Retic Ct Pct: 1.2 % (ref 0.4–3.1)

## 2010-05-15 LAB — URINALYSIS, ROUTINE W REFLEX MICROSCOPIC
Protein, ur: 30 mg/dL — AB
Urobilinogen, UA: 0.2 mg/dL (ref 0.0–1.0)

## 2010-05-15 LAB — DIFFERENTIAL
Eosinophils Absolute: 0 10*3/uL (ref 0.0–0.7)
Eosinophils Relative: 0 % (ref 0–5)
Lymphocytes Relative: 8 % — ABNORMAL LOW (ref 12–46)
Lymphs Abs: 0.9 10*3/uL (ref 0.7–4.0)
Monocytes Relative: 7 % (ref 3–12)
Neutrophils Relative %: 85 % — ABNORMAL HIGH (ref 43–77)

## 2010-05-15 LAB — HEPATIC FUNCTION PANEL
ALT: 27 U/L (ref 0–35)
Albumin: 3.4 g/dL — ABNORMAL LOW (ref 3.5–5.2)
Alkaline Phosphatase: 77 U/L (ref 39–117)
Total Bilirubin: 1 mg/dL (ref 0.3–1.2)
Total Protein: 6.5 g/dL (ref 6.0–8.3)

## 2010-05-15 LAB — SEDIMENTATION RATE: Sed Rate: 98 mm/hr — ABNORMAL HIGH (ref 0–22)

## 2010-05-15 LAB — AMYLASE: Amylase: 277 U/L — ABNORMAL HIGH (ref 27–131)

## 2010-05-15 LAB — VITAMIN B12: Vitamin B-12: 680 pg/mL (ref 211–911)

## 2010-05-15 LAB — URINE MICROSCOPIC-ADD ON

## 2010-05-15 LAB — LIPASE, BLOOD
Lipase: 33 U/L (ref 11–59)
Lipase: 34 U/L (ref 11–59)

## 2010-05-15 LAB — FOLATE: Folate: 9.7 ng/mL

## 2010-06-19 NOTE — H&P (Signed)
NAMETILLY, PERNICE                ACCOUNT NO.:  [id number]   MEDICAL RECORD NO.:  [id number]          PATIENT TYPE:  INP   LOCATION:  6736                         FACILITY:  [facility]   PHYSICIAN:  Virgie Dad, MD     DATE OF BIRTH:  1940-10-08   DATE OF ADMISSION:  08/28/2008  DATE OF DISCHARGE:                              HISTORY & PHYSICAL   PRIMARY CARE PHYSICIAN:  Unknown, Unassigned.   CHIEF COMPLAINT:  Known cholelithiasis now having another episode of  abdominal pain, nausea, and vomiting of 12 hours duration.   HISTORY OF PRESENT ILLNESS:  This 70 year old African American female  non-alcohol drinker, known cholelithiasis with recurrent cholecystitis  and pancreatitis, and pseudocyst of the pancreas is once again at the  emergency room for complaints of nausea, vomiting, and abdominal pain.  In may of this year, she was admitted for acute pancreatitis and was  found to have gallstone-induced pancreatitis, was treated medically  because she was quite ill at that time and she was supposed to have her  cholecystectomy on September 13, 2008.  Since May 2010, she has been to the  emergency room 3 times and this is the third time for nausea, vomiting,  and abdominal pain.  Few weeks ago, she was in ED for the same  complaints and was given Protonix and promethazine, which quiet down the  nausea feeling for a while.  The patient denies any fever and chills.  She did say she has some sort of diarrhea the past 24 hours.   PAST HISTORY:  As stated in May 2010, she was here for cholelithiasis,  cholecystitis and pancreatitis and had quite long stay of 21 days, part  of it were in ICU.  Cholecystectomy was performed when the cholecystitis  and pancreatitis had subsided and it is scheduled for September 13, 2008.Today's  Workup in the ED showed that the white count is 7000 with  a shift to the left, hemoglobin 12.1, hematocrit 36.3, and platelet  count 260,000.  Sodium 144, potassium 3.2,  chloride 108, glucose 119,  BUN 5, creatinine 0.61, calcium 9.6, SGOT and SGPT normal.  Lipase was  normal.  Chest x-ray because the patient complained of some shortness of  breath with a desaturation to 85%.  Chest x-ray showed cardiomegaly with  some vascular congestion, basilar atelectasis.  No interval change as of  August 15, 2008.  CT of the abdomen and pelvis showed left effusion has  resolved, mild atelectasis and scarring of the lower lobes of the lung,  less ascites.  Liver appears normal.  Small gallstones dependent in the  gallbladder, spleen normal, left pancreatic inflammatory change.  The  patient has multiple pseudocyst of the pancreas, the largest is at the  pancreatic tail region measuring 9.4 x 5.7 x 9.6 cm, smaller pseudocyst  in the head measures 3.6 x 2.7 x 3.4 cm.  Edematous with some degree of  mesenteritis.  Electrocardiogram showed 76 rate, normal sinus rhythm.  No SVT changes.  In the ED, she had received Dilaudid IV and Zofran IV  to control her  pain and now she had vomiting and also received a liter  of normal saline.  The attending ED had suspected infected pseudocyst  and she had drawn blood cultures after which Flagyl 500 mg IV initial  dose and Cipro 400 mg IV initial dose were given.  Oxygen was given at 2  L per minute when the patient desaturated to 85%, at 2 L of oxygen per  nasal cannula O2 saturation improved to 98%.   SOCIAL HISTORY:  She lives with her son who was here to give me the  history and physical since the patient seemed to be nonconversant, says  that she is so nauseous in spite of the Zofran.  She does not smoke and  she never had alcohol.   FAMILY HISTORY:  No history of pancreatitis in the family.  Diabetes,  high blood pressure, and gallstone is positive in the family.   REVIEW OF SYSTEMS:  Other than nausea, vomiting, and abdominal pain and  some degree of shortness of breath, no chest pain, no fever, no chills.  Did have an episode  of diarrhea.  No urinary complaints.   PHYSICAL EXAMINATION:  VITAL SIGNS:  Temperature 97.9, blood pressure  115/70, pulse 89, respirations 18, and O2 sat on arrival in the ED was  99% and later on she desaturated to 85% after which she was given 2 L of  nasal oxygen and the current O2 sat is 99-100.  This was noted while the  patient was in pain after Dilaudid and Zofran, with 2 L of oxygen O2 sat  is 99% to 100%.  SKIN:  Warm and dry.  HEENT:  Pupils are equal and reacting to light.  No scleral icterus.  The tongue is moist.  NECK:  Neck veins are flat.  Thyroid gland normal.  The carotids have  normal upstroke.  CHEST:  Clear to palpation and auscultation.  No wet rales.  No rhonchi.  No pleural rub.  HEART:  80 per minute, regular.  S1 and S2 normal.  No murmur.  No S4.  No gallop.  No pericardial rub.  ABDOMEN:  Periumbilical tenderness with some guarding over the left  upper quadrant.  No palpable mass.  Bowel sounds are normoactive.  EXTREMITIES:  No sign of DVT.  No pedal edema.  Circulation is normal.  NEUROLOGIC:  Entirely normal except that the patient is rarely  conversant due to pain and then later on becoming sleepy after Dilaudid  1 mg IV and Zofran 4 mg IV.   ASSESSMENT AND PLAN:  This is a 70 year old African American female with  known history of cholelithiasis with cholecystitis and recurrent  pancreatitis probably from gallstone pancreatitis, had long stay  including ICU hospitalization in May 2010, treated with medications and  antibiotics for the cholecystitis and cholecystectomy has been deferred  on September 13, 2008.  Since May 2010, she had come to the emergency room  3 times for nausea and vomiting.  This is the third time in the last 3  months and the following findings are noted;  1. Recurrent pancreatitis, cholelithiasis, and cholecystitis.  CT scan      showed less inflammation.  CT scan showed pseudocyst of the tail      and the head of the pancreas.   No leukocytosis.  No fever, but      strongly entertain infected pseudocyst, acute cholecystitis.  So,      after blood cultures were taken, Flagyl 500 mg initial dose,  this      will be continued every 6 hours and addition of Cipro 400 mg IV      initial dose.  We will continue Cipro 400 mg IV q.12 h, pending      results of the culture.  We will continue Dilaudid 1 mg q.4 h.      p.r.n. for pain.  We will continue Zofran 4 mg q.4 h. p.r.n. for      nausea and vomiting. kEEP NPO; Continue hydration at 125 mL with      normal saline.  We will once again reconsult surgery and see if      cholelithiasis cholecystectomy can be done at an earlier time since      it is scheduled September 13, 2008.  We will monitor electrolytes,      lipase, serum calcium closely.  We will continue O2 at 2 L per      minute.  Chest x-ray showed vascular congestion.  We will order BMP      to rule out congestive heart failure.  Review of the current      medications, the patient is only on Protonix and promethazine.  We      will continue Protonix.  We will discontinue promethazine.   We need another CT scan to assess the status of the pseudocyst.  Consider aspiration or drainage of the pseudocyst, although on the CT  scan, it does not look like it has turned into   Prognosis: guarded  Ethics:Full Code   AFFECTS:  Full code.      Virgie Dad, MD  Electronically Signed     EA/MEDQ  D:  08/29/2008  T:  08/30/2008  Job:  [id number]

## 2010-06-19 NOTE — H&P (Signed)
[name], [name]                ACCOUNT NO.:  [id number]   MEDICAL RECORD NO.:  [id number]          PATIENT TYPE:  INP   LOCATION:  5123                         FACILITY:  [facility]   PHYSICIAN:  Clovis Pu. Cornett, M.D.DATE OF BIRTH:  07-30-1940   DATE OF ADMISSION:  10/01/2008  DATE OF DISCHARGE:                              HISTORY & PHYSICAL   CHIEF COMPLAINT:  Abdominal pain, nausea, and vomiting.   HISTORY OF PRESENT ILLNESS:  The patient is a 70 year old female, who  presents to the emergency room with a 6-hour history of abdominal pain  which is severe in nature, located in the right upper quadrants, right  lower quadrant, periumbilical region, and epigastrium; and nausea and  vomiting.  Her pain, nausea and vomiting started about 6 o'clock  tonight.  It has progressively worsened.  Last bowel movement was 2 days  ago.  She is passing gas.  She is 1 month status post open  cholecystectomy by Dr. Almond Lint for chronic cholecystitis and  gallstone pancreatitis.  Of note, she has a history of necrotizing  pancreatitis and large pseudocyst.  Her last CT scan about a month ago.  The pain is diffuse in nature and located in the right upper quadrant,  right lower quadrant, and epigastrium is at least anywhere from an 8/10  associated with nausea and vomiting, made better with medications.  Plain films were obtained by the emergency room doctor which showed  stool in the colon and a questionable partial small-bowel obstruction.   PAST MEDICAL HISTORY:  Please see above.   PAST SURGICAL HISTORY:  Please see above.   MEDICATIONS:  1. Percocet for pain as needed.  2. Protonix 40 mg everyday.  3. Phenergan 12.5 mg daily.  4. Aspirin 81 mg daily.   FAMILY HISTORY:  Noncontributory.   ALLERGIES:  None.   REVIEW OF SYSTEMS:  As above, otherwise negative x15.   PHYSICAL EXAMINATION:  VITAL SIGNS:  Temperature 98, pulse 74, and blood  pressure 115/78.  GENERAL APPEARANCE:   Pleasant female in no apparent distress.  HEENT:  She is wearing glasses.  No evidence of scleral icterus.  Oropharynx is dry.  NECK:  Supple and nontender.  Trachea midline.  PULMONARY:  Lung sounds are clear bilaterally.  Chest wall excursion is  normal bilaterally.  CARDIOVASCULAR:  Regular rate and rhythm without rub, murmur, or gallop.  EXTREMITIES:  Warm and well perfused.  ABDOMEN:  Mildly distended, tender in her right lower quadrant  minimally, periumbilical region, and epigastrium.  Bowel sounds are  present, normoactive.  Right upper quadrant wound is well healed.  Periumbilical wound well healed.  No peritonitis.  EXTREMITIES:  No edema, clubbing, or cyanosis.  Muscle tone and range of  motion appear grossly normal.  NEUROLOGIC:  Glasgow coma scale is 15.  Motor and sensory function are  intact.   DIAGNOSTIC STUDIES:  We reviewed her plain films which show a lot of  stool in the colon, questionable partial small-bowel obstruction versus  ileus.  Her white count is normal at 7900 with a hemoglobin of 13, and  platelet count of 296,000.  Sodium 139, potassium 3.7, chloride 100, CO2  29, BUN 2, creatinine 0.57, glucose 126, and lipase is 95.   IMPRESSION:  A 70 year old female, one month status post open  cholecystectomy with a history of chronic pancreatitis, chronic  pseudocyst, constipation, and nausea and vomiting.   PLAN:  She will be admitted for IV fluids, n.p.o., analgesia.  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      Thomas A. Cornett, M.D.  Electronically Signed     TAC/MEDQ  D:  10/02/2008  T:  10/02/2008  Job:  [id number]

## 2010-06-19 NOTE — Group Therapy Note (Signed)
[name], [name]                ACCOUNT NO.:  [id number]   MEDICAL RECORD NO.:  [id number]          PATIENT TYPE:  INP   LOCATION:  3312                         FACILITY:  [facility]   PHYSICIAN:  Monte Fantasia, MD  DATE OF BIRTH:  1940-08-08                                 PROGRESS NOTE   PRIMARY CARE PHYSICIAN:  Unassigned.   INTERIM DIAGNOSES:  1. Necrotizing pancreatitis.  2. Cholelithiasis.  3. Acute respiratory failure, resolved.  4. Sepsis, resolved.  5. History of ductal carcinoma in situ.  6. History of dyslipidemia.   DISCHARGE MEDICATIONS:  These will be dictated at the time of final discharge summary.   COURSE DURING THE HOSPITAL STAY:  [name] is a 70 year old African American lady patient who was  admitted on Jun 28, 2008, with complaints of abdominal pain.  The  patient had experienced abdominal pain associated with nausea and  vomiting with a pain scale of 10/10 in intensity.  The patient had a CT  scan of the abdomen and pelvis done on admission which showed moderate  to severe pancreatitis with areas of pancreatic necrosis and moderate  acute fluid collections.  The patient was started on aggressive IV fluid  hydration.  The patient, however, was later transferred to the ICU for  further management due to acute respiratory distress.  The patient was  started on Primaxin for the same and the patient was kept n.p.o. and  given pain control medications and aggressive fluid hydration.  The  patient was evaluated by gastroenterology for the acute severe  necrotizing pancreatitis with cholelithiasis and sepsis.  Also, a  surgical consult was called in for.  Both recommended to continue  supportive care and IV antibiotics.  In case the patient worsens with  increasing WBCs and fevers, will need FNA for the same.  The patient  continued to have aggressive supportive care with n.p.o., IV fluids and  imipenem with the critical care service.  Both GI and surgery  continued  to follow the patient.  The patient in view of the acute respiratory  distress was followed on an ARDS protocol and diuresed.  The patient  later had acute respiratory distress on Jul 04, 2008, and hence needed  to be intubated for the same.  The patient was later gradually weaned to  extubate.  In view of the patient being n.p.o. for multiple days, the  patient was then started on total parenteral nutrition on July 06, 2008.  The patient later was extubated on July 06, 2008, and the patient  sustained well status post extubation.  Later the nutrition TNA was  suggested to make nutritional needs of the patient.  The patient had  repeat CT scan of the abdomen without contrast showing enlarging  bilateral pleural effusions with worsening bibasilar atelectasis or  consolidations, pancreas heterogenous and attenuation suggesting areas  of pancreatic necrosis, increasing inflammatory changes of the  meseteries are visualized. Small to moderate abdominal ascites and  cholelithiasis noted within the gallbladder, questionable ileus.  The  patient improved well in her mental status through the stay in the ICU.  The patient's respiratory failure improved well and did not have any  apparent respiratory distress.  Later, the patient was transferred to  hospitalist service on July 09, 2008, for further management.  The  patient continued to improve well through the stay in the hospital,  however, initially was continued to be kept n.p.o.  Later, the patient  had a Panda tube placed and enteral feedings were started for the same.  The patient also received physical therapy with out of bed to chair and  gradual mobilization.  However, the patient later had the Panda tube  removed accidentally and the patient was started on clear liquids and  the patient clinically improved well with no increasing abdominal pains.  At present, the liquids have been adjusted to full liquids with low-fat  and a  repeat abdominal CT and pelvis was done on July 14, 2008, which  showed necrotizing pancreatitis with developing peripancreatic fluid  collections, new since Jun 28, 2008.  Similar amount of small amount of  ascites, heterogenous enhancement of the kidneys.  The CT pelvis showed  decreased pelvic ascites, right paracentral pelvic wall laxity versus  hernia containing nonobstructive small bowel.  The CT chest showed  bilateral pleural effusion, left greater than right with bibasilar  collapse and consolidative change, suspicious for infection, less likely  to be atelectasis.  Cardiomegaly with findings suspicious for pulmonary  artery hypertension and filling defect in the left internal jugular may  be artifactual seen on the CT.  The patient's Primaxin was discontinued  after 10 days of antibiotics.  However, with the CT chest showing  pleural effusion and with underlying increasing leukocytosis.  The  patient was started on vancomycin and Zosyn for presumed pneumonia.  The  patient improved well with her respiratory distress.  Also, the patient  in view of the asymmetric pleural effusion underwent a pleural tap on  July 15, 2008, by pulmonary.  The pleural tap was transudative with no  evidence of any infection.  However, blood cultures to date have been no  growth.  If blood cultures remain no growth for 5 days, could consider  to discontinue antibiotics.  In the interim stay during the hospital,  the patient also had episodes of diarrhea for which the patient was  placed on contact isolation.  Initially C. diff was ordered.  However,  after that the patient had no bowel movements and, hence, for 48 hours  the C. diff and contact isolation were discontinued.  The patient had no  episodes of diarrhea thereafter.  The patient has been started on Flora-  Q for the same.  The patient has been improving well with physical  therapy.  At present, the patient is tolerating physical therapy well   and will continue to monitor closely.  In view of recovering necrotizing  pancreatitis with developing pseudocyst as per surgery, the patient  needs to resolve with her pancreatitis and will likely have no surgery  during this hospitalization and will need to follow up as an outpatient  for surgical evaluation for laparoscopic cholecystectomy.   RADIOLOGICAL INVESTIGATIONS DONE DURING THE STAY IN THE HOSPITAL:  1. CT scan of the abdomen and pelvis with contrast done on Jun 28, 2008.  CT abdomen impression is moderate to severe pancreatitis      with areas pancreatic necrosis and moderate acute fluid collection,      small amount of ascites and tiny bilateral pleural effusions.  Cholelithiasis seen.  CT pelvis impression; small amount of free      fluid in the pelvis.  2. Chest x-ray done on Jun 28, 2008, impression; bibasilar atelectasis      and mild cardiomegaly.  3. Chest x-ray done on Jul 01, 2008, impression; left IJ in good      position, no pneumothorax, increased bilateral pleural effusion and      air space disease.  4. Chest x-ray done on Jul 01, 2008, impression; right main stem      intubation with diffuse atelectasis.  The endotracheal tube should      be pulled back 3-4 cm.  No change in the air space disease on the      right.  Left central line deep cavoatrial junction, no      pneumothorax.  5. Chest x-ray done on Jul 01, 2008, impression; endotracheal tube in      good position, 4-5 cm above the carina, improved aeration on the      left lung, bilateral air space disease with lymphedema.  6. Ultrasound of the abdomen portable done on Jul 01, 2008,      impression; normal common bile duct, cholelithiasis without      evidence of cholecystitis, hyperechoic pancreas consistent with      pancreatitis, intraperitoneal free fluid representing ascites,      small amount of pleural effusion.  7. Chest x-ray done on Jul 02, 2008, impression; stable effusions,       bibasilar atelectasis.  8. Chest x-ray done on Jul 03, 2008, impression; interval increase in      congestive heart failure and edema.  9. Chest x-ray done on Jul 04, 2008, impression; stable air space      disease and bilateral pleural effusions.  10.Chest x-ray done on July 05, 2008, impression; no change in CHF      pattern.  11.Chest x-ray done on July 06, 2008, impression; stable radiographic      appearance of the chest.  12.Abdominal x-ray done on July 06, 2008, impression; soft feeding      tube keep in the proximal duodenum.  13.Abdominal x-ray done on July 06, 2008, impression; feeding tube in      the third portion of the duodenum.  14.Abdominal x-ray done on July 07, 2008, impression; feeding tube tip      in the third portion of the duodenum.  15.CT of the abdomen without contrast done on July 07, 2008,      impression; enlarging bilateral pleural effusions, worsening      bibasilar atelectasis or consolidation, findings compatible with      moderate to severe acute pancreatitis.  Pancreas is heterogenous in      attenuation, suggesting ileus or pancreatic necrosis.  No discrete      pseudocyst identified at this time.  Increasing inflammatory      changes are visualized in the abdomen secondary to acute      pancreatitis, small to moderate abdominal ascites, cholelithiasis,      stones noted to be in the gallbladder and the neck or the cystic      duct, questionable early ileus.  16.Chest x-ray done on July 08, 2008, impression; worsening bibasilar      aeration, possible enlarging left pleural effusion following      extubation.  No pneumothorax.  17.Chest x-ray done on July 12, 2008, impression; decreased lung      volumes similar to worsening bibasilar air space disease.  May      represent pulmonary edema or multifocal infection or aspiration.      Decreased sensitivity of the exam due to technique and related      factors.  Probability of bilateral pleural  effusion.  18.CT of the abdomen, pelvis and chest with contrast.  CT chest      impression; bilateral pleural effusions with left greater than      right, bibasilar collapse, consolidative change suspicious for      infection versus atelectasis.  Cardiomegaly with findings      suspicious for pulmonary arterial hypertension, mild motion      degraded exam, apparent filling defect within the left internal      jugular vein which may be artifactual due to mixing. 19.  CT      abdomen impression; necrotizing pancreatitis with developing      peripancreatic fluid collections.  Similar small amount of ascites,      heterogenous enhancement of ascites, consider correlation with      urinalysis.  CT pelvis impression; decreased pelvic ascites since      prior CT.  Right paracentral pelvic wall laxity versus hernia      containing nonobstructive small-bowel.  19.Chest x-ray done on July 15, 2008, impression; left PICC line,      slight improving in aeration with continued low volumes and      bibasilar atelectasis and effusions.   LABORATORY DATA DONE DURING THE STAY IN THE HOSPITAL:  Total WBC 9.7, improved from 18.1.  CBC 9.7, hemoglobin 9.8, hematocrit  29.3, platelet count of 342, sodium 139, potassium 3.6, chloride 103,  bicarb 30, glucose 92, BUN 5, creatinine 0.53, total bilirubin 0.8,  alkaline phosphatase 74, AST 41, ALT 37, total protein 5.9, albumin 1.9,  calcium of 8.3, lipase 149.  Glucose 105, total protein less than 3,  amylase 203, LDH 247, WBC 1170, segmented neutrophils 38, lymphocytes  11, macrophages 49.  Cultures of the body fluid have been no organisms  seen, no growth for 2 days.  Blood cultures have been no growth to date.   PHYSICAL EXAMINATION:  VITAL SIGNS:  Temperature of 98.8, pulse of 97, respirations 38, and  blood pressure 120/60.  HEENT/NECK:  Neck is supple.  Pupils equal and  reactive to light.  No pallor.  No lymphadenopathy.  RESPIRATORY:  Air  entry is  bilaterally equal.  Bibasilar crackles plus no rales.  CARDIOVASCULAR:  S1-S2.  Regular rate and rhythm.  ABDOMEN:  Soft.  Minimal distention plus minimal tenderness along the  epigastric region and no guarding or rigidity.  EXTREMITIES:  No edema of the feet.  CNS:  The patient is alert, awake and oriented x3.  No focal  neurological deficits.  SKIN:  Intact.  No evidence of any skin rashes.   DISPOSITION:  The patient at present is not medically stable to be discharged.  The  patient's diet needs to be advanced gradually and slowly to full diet  with continuous monitoring symptomatically with the abdominal pain.  If  the patient has increasing abdominal pain during the advancing of diet,  we will hold off on advancing diet and keep her n.p.o. for then.  The  patient is being followed closely by surgery for her necrotizing  pancreatitis.  At present, any surgical intervention is deferred and  will need surgical intervention as an outpatient for laparoscopic  cholecystectomy.  The patient is improving well on her physical therapy.  The patient is on  vancomycin, Zosyn and p.o. Flagyl for her pneumonia.  We will follow up with the blood cultures.  If the cultures for 5 days  have been no growth, would consider to discontinue antibiotics at this  point.  The patient's leukocytosis has improved well.  More likely the  pleural effusions were more likely secondary to pancreatitis and  inflammation than the pneumonia.  Total time of dictation 1 hour.      Monte Fantasia, MD  Electronically Signed     MP/MEDQ  D:  07/18/2008  T:  07/18/2008  Job:  [id number]

## 2010-06-19 NOTE — Op Note (Signed)
NAMERAMATA, STROTHMAN                ACCOUNT NO.:  [id number]   MEDICAL RECORD NO.:  [id number]          PATIENT TYPE:  AMB   LOCATION:  DAY                          FACILITY:  [facility]   PHYSICIAN:  Almond Lint, MD       DATE OF BIRTH:  Jul 13, 1940   DATE OF PROCEDURE:  09/13/2008  DATE OF DISCHARGE:                               OPERATIVE REPORT   PREOPERATIVE DIAGNOSIS:  Gallstone pancreatitis.   POSTOPERATIVE DIAGNOSIS:  Gallstone pancreatitis.   PROCEDURE PERFORMED:  Laparoscopic converted to open cholecystectomy.   SURGEON:  Almond Lint, MD.   ASSISTANTSharlet Salina T. Hoxworth, M.D.   ANESTHESIA:  General and local.   FINDINGS:  Adhesions all along the anterior abdominal wall prohibited to  lap-chole.   SPECIMEN:  Gallbladder to pathology.   ESTIMATED BLOOD LOSS:  25 mL.   COMPLICATIONS:  None.   PROCEDURE:  Ms. Murren was identified in the holding area and taken to  the operating room where she was placed supine on the operating room  table.  General endotracheal anesthesia was induced.  Her abdomen was  prepped and draped in a sterile fashion.  A timeout was performed  according to the surgical safety check list.  When all was correct, we  continued.  The infraumbilical skin was anesthetized with a mixture of  1% lidocaine plain and quarter-percent Marcaine with epinephrine.  A  curvilinear incision was made beneath the umbilicus.  A Kelly clamp was  used to bluntly divide the subcutaneous tissues.  There was some  additional scar tissue which was taken down with Metzenbaum scissors.  The fascia was identified and elevated with two Kocher clamps on either  side of the midline.  This was entered with the #11 blade.  There were  significant adhesions here, but these were divided off of the posterior  fascia and a pursestring suture was placed under direct visualization.  The Roseanne Reno was introduced into the abdomen and a pneumoperitoneum was  attempted to be achieved.   The abdomen did not inflate significantly.  A  camera was used to take a peak, but clearly the port was behind the  colon.  The port was pulled back somewhat and the abdomen was  reexamined.  The port was still behind the colon and the small bowel.  The port was completely removed and a window underneath the fascia was  attempted to be created bluntly with finger dissection.  The port was  placed here and there were still significant adhesions.  The camera was  advanced through some of the filmy adhesions toward the right upper  quadrant, but still there was no open space to in order to place  additional ports.  The gallbladder was not able to be visualized.  The  camera and port were pulled back.  As this was pulled back, the colon  was examined and there was no sign of injury.  The port was left in  place until the end of the procedure.  A subcostal incision was made in  the right upper quadrant after evacuating the remainder of  the air in  the abdomen.  This was approximately 10-12 cm in length.  The  subcutaneous tissue was divided with the Bovie electrocautery.  The  rectus on the right, as well as the fascia and muscles laterally were  divided with the Bovie electrocautery as well.  The right upper quadrant  was actually free of adhesions once the fascia was opened.  The  falciform was left in place as it was not in the way of the operation.  The gallbladder was immediately seen and was not adherent to anything.  This was elevated with a Kelly clamp.  Handheld retractors were used to  retract the superior abdominal wall and the anterior abdominal wall with  the colon.  The gallbladder was elevated with the [facility] and was taken  off the liver with the Bovie.  A tonsil clamp was used to facilitate  this dissection by helping open up the plane.  At one point, the liver  capsule was completely stuck to the gallbladder and the liver capsule  was divided at one point.  The correct plane was  reentered and the  gallbladder taken down the rest of the way.  A right-angle was used to  dissect the cystic artery.  The cystic artery was skeletonized with a  right-angle clamp and clipped with the clip applier that had been opened  for the lap-chole.  This was clipped twice on the patient's side and  once on the specimen side and divided with the Metzenbaum scissors.  The  cystic duct was then skeletonized and clamped with two right angle  clamps.  The cystic duct was divided between the right-angle clamp and  Metzenbaum scissors.  A 2-0 silk suture ligature was applied to the  cystic duct.  The clamp was removed and then a clip was placed over the  top of the suture.  The suture was then cut.  The area was irrigated  copiously and the gallbladder fossa was coagulated with the Bovie  electrocautery.  A piece of Surgicel was placed here.  The gallbladder  had been extremely intrahepatic and this space closed up on itself with  the gallbladder out.  The arterial stump was identified and there was no  bleeding seen or biliary leakage.  These clips were intact.  The fascia  was closed in two layers using 0 looped PDS sutures.  The skin was  irrigated and then undermined to avoid dimpling.  This was then closed  using staples.  Attention was then redirected to the umbilical incision.  The trocar was removed and there was a reexamination of the underlying  contents.  There was again no injuries seen to any of the bowel and the  pursestring suture was noted to be only in the fascia.  This was tied  down and the fascial incision was inspected for tightness.  There was no  additional defects palpated.  This was then irrigated and closed as well  with staples.  Of note, prior to stapling the right upper quadrant  incision, the muscle layers were anesthetized with additional local  anesthetic.  The wounds were cleaned, dried and dressed with Island  dressings.  The patient was way from anesthesia  and taken to the PACU in  stable condition.  Needle and sponge counts were correct x2.      Almond Lint, MD  Electronically Signed     FB/MEDQ  D:  09/13/2008  T:  09/13/2008  Job:  [id number]

## 2010-06-19 NOTE — Consult Note (Signed)
[name], [name]                ACCOUNT NO.:  [id number]   MEDICAL RECORD NO.:  [id number]           PATIENT TYPE:   LOCATION:                                 FACILITY:   PHYSICIAN:  Clovis Pu. Cornett, M.D.DATE OF BIRTH:  October 06, 1940   DATE OF CONSULTATION:  07/01/2008  DATE OF DISCHARGE:                                 CONSULTATION   TIME OF CONSULTATION:  15:54 p.m.   REQUESTING PHYSICIAN:  Dr. Judie Petit T. Russella Dar, MD, [facility], Gastroenterology.   CONSULTING SURGEON:  Thomas A. Cornett, MD   PRIMARY CARE PHYSICIAN:  Dr. Freddrick March of Critical Care Medicine.   REASON FOR CONSULTATION:  Necrotizing pancreatitis.   HISTORY OF PRESENT ILLNESS:  [name] is a 70 year old black female  with a history of ductal carcinoma in situ and hyperlipidemia who began  not feeling well on Monday.  She subsequently had mild abdominal pain  that acutely worsened on Tuesday.  There is very little history known as  the patient is currently intubated and lives alone, and the daughter is  providing the history and does not know much.  The daughter states that  due to her severe onset of pain on Tuesday, she was then presented to  the emergency department.  Once here, she was found to have severe  pancreatitis with the lipase greater than 2000.  CT scan was performed,  which showed severe pancreatitis with possible necrotizing process, and  moderate acute fluid collection.  Also seen on CT where gallstones in  the gallbladder.  Upon admission, her LFTs were all normal and she had a  white count of 11,000.  The patient was then admitted to a med/surg bed,  but quickly developed sepsis and increased work of breathing.  The  patient was then moved to the ICU due to her respiratory status.  She  developed respirations in the 50s and subsequently today, she was  intubated due to work of breathing.  At that time, her LFTs remained  normal.  Her lipase has come down to approximately 104.  Her LFTs all  remained normal as well.  The patient has been on Primaxin since  admission.  At this time, we were consulted due to the patient's  necrotizing pancreatitis.   REVIEW OF SYSTEMS:  Unable to be obtained as the patient is intubated  and somewhat sedated.   PAST MEDICAL HISTORY:  1. Ductal carcinoma in situ.  2. Hyperlipidemia.   PAST SURGICAL HISTORY:  1. Lumpectomy.  2. Status post C-section.   SOCIAL HISTORY:  The patient is widowed.  She has three children.  She  does not smoke and rarely drinks any alcohol.   ALLERGIES:  NKDA.   MEDICATIONS:  Aspirin 81 mg.   PHYSICAL EXAMINATION:  GENERAL:  [name] is a 70 year old black  female who is mildly sedated and intubated on the ventilator.  She is an  obese female.  VITAL SIGNS:  Temperature 99.3, pulse 120, blood pressure 110/67,  respirations 17 on full ventilator support.  HEENT:  Head is normocephalic, atraumatic.  Sclerae are unable to be  assessed as well as her pupils as her eyes are closed.  Ears and nose  without any obvious masses or lesions.  No rhinorrhea.  Mouth has an ET  tube, as well as an OT tube present.  NECK:  Supple.  Trachea is midline.  No thyromegaly.  HEART:  Regular rhythm; however, she is tachycardiac.  No murmurs,  gallops, or rubs are noted.  +2 carotid, radial, and pedal pulses  bilaterally.  LUNGS:  Relatively clear to auscultation bilaterally; however, she does  have some coarse sounds due to being on the ventilator.  Otherwise, no  wheezes, rhonchi, or rales are noted.  ABDOMEN:  Soft, tender in the left upper quadrant with no tenderness in  the right upper quadrant.  She has a negative Murphy sign.  Negative  guarding.  She has hypoactive bowel sounds and seems somewhat distended  and obese.  MUSCULOSKELETAL:  All four extremities are symmetrical with no cyanosis,  clubbing, or edema noted.  NEURO/PSYCH:  Unable to be assessed at this time due to the patient  being mildly sedated and on  the ventilator.   LABORATORY DATA AND DIAGNOSTICS:  Lipase is 104 down from greater than  2000 on admission.  Sodium 54, potassium 3.9, glucose 140, BUN 23,  creatinine 0.94, AST 40, ALT 23, alkaline phosphatase 61, total  bilirubin 1.2.  White blood cell count is 10,900; hemoglobin 11;  hematocrit 32.3; platelet count is 182,000.  Lactic acid is 1.3.  An  ultrasound is pending.  CT scan reveals cholelithiasis as well as a  severe pancreatitis with possible necrotizing process and possible acute  fluid collection.   IMPRESSION:  1. Severe pancreatitis, probably secondary to cholelithiasis.  2. Acute respiratory failure.  3. Sepsis.  4. History of ductal carcinoma in situ.  5. History of hyperlipidemia.   PLAN:  At this time, we would recommend continued supportive care.  If  the patient develop an increasing white count or begins to need pressor  support, then we would recommend at that time the patient get a fine  needle aspiration by Interventional Radiology to determine if the  pancreas is sterile or nonsterile.  If this area is sterile, we would  proceed with supportive care; however, if that is nonsterile, we may  need to proceed with surgical intervention.  At this time, we will  continue to follow along with you.      Letha Cape, PA      Maisie Fus A. Cornett, M.D.  Electronically Signed    KEO/MEDQ  D:  07/01/2008  T:  07/02/2008  Job:  [id number]

## 2010-06-19 NOTE — Discharge Summary (Signed)
NAMECLARE, FENNIMORE NO.:  [id number]   MEDICAL RECORD NO.:  [id number]          PATIENT TYPE:  INP   LOCATION:  3742                         FACILITY:  [facility]   PHYSICIAN:  Ruthy Dick, MD    DATE OF BIRTH:  04-30-1940   DATE OF ADMISSION:  06/28/2008  DATE OF DISCHARGE:  07/25/2008                               DISCHARGE SUMMARY   REASON FOR ADMISSION:  Abdominal pain and low blood pressure.   FINAL DISCHARGE DIAGNOSES:  1. Necrotizing pancreatitis.  2. Cholelithiasis.  3. Acute respiratory failure status post intubation and extubation.  4. Sepsis syndrome.  5. History of ductal carcinoma in situ.  6. History of dyslipidemia.  7. Pneumonia, treated.  8. Pleural effusion status post thoracentesis.  9. Hypokalemia, resolved.  10.Chronic anemia.  11.Leukocytosis, resolved.   CONSULTATIONS DURING THIS ADMISSION:  1. Critical Care consult.  2. Gastroenterology consult.  3. Surgical consult.   PROCEDURES DONE DURING THIS ADMISSION:  1. CT scan of the abdomen, done on Jun 28, 2008.  Impression:  Moderate to severe pancreatitis with areas of pancreatic  necrosis and moderate acute fluid collection.  Small amount of ascites  and tiny bilateral pleural effusions noted.  Cholelithiasis was also  seen.  1. CT of the pelvis showed only small amount of free fluid.  2. Ultrasound of the abdomen was done on Jul 01, 2008, and it showed      normal common bile duct with cholelithiasis without evidence of      cholecystitis, hyperechoic pancreas, consistent with pancreatitis,      ascites also noted.  3. Repeat CT scan of the abdomen with contrast done on July 07, 2008.  Impression was enlarging bilateral pleural effusions and worsening  bilateral atelectasis or consolidation and findings were compatible with  that of severe acute pancreatitis.  The pancreas was said to be  heterogenous in attenuation suggesting pancreatic necrosis.  Ascites and  cholelithiasis  also noted in this.  1. On July 14, 2008, a CT scan of the chest with contrast was done and      was read as having bilateral pleural effusions with left greater      than right basilar collapse and consolidative change, suspicious      for infection, and there was more patchy upper lobe airspace      disease, which was suspicious for infection.  2. CT of the abdomen was also done on the same day, and this      impression was that of for necrotizing pancreatitis with developing      peripancreatic fluid collection as described.  This was thought to      be new since May 25th finding but unchanged from the June 3rd      finding and CT of the pelvis was said to have decreased pelvic      ascites.  3. Also thoracentesis was done, and the findings did not show any sign      of overt infection.   BRIEF HISTORY OF PRESENT ILLNESS AND HOSPITAL COURSE:  Ms. Braddock is a  70 year old  African American lady with past medical history of ductal  carcinoma in situ and dyslipidemia, who came into the hospital with  abdominal pain.  CAT scan revealed that the patient did have a  necrotizing pancreatitis.  She subsequently decompensated and was in the  intensive care unit.  On Jul 04, 2008, her condition was such that she  required intubation with mechanical ventilation due to respiratory  failure.  She was ultimately extubated on July 06, 2008, and she has done  well gradually since then.  She also required total parenteral nutrition  at some point but has been able to eat by mouth at this point.  She has  had a very prolonged hospitalization because of the severity of  necrotizing pancreatitis.  She also had some complication, which  included pneumonia and pleural effusion.  The pleural effusion was  drained, and pneumonia treated with antibiotics.  She had diarrhea at  that point and was started on Flora-Q, and she has done well since then.  C. diff was not done because her diarrhea resolved by itself.   The  patient was followed by the surgeon and the plan of care is to have the  patient come back for surgery in about 4 weeks, also they are left with  the phone number in the instruction paper for the patient to call them  and schedule an appointment.  Because of prolonged hospitalization, the  patient required physical therapy and occupational therapy to be able to  get stronger on her feet, so at this point, the patient is able to get  up and go to the bathroom, sink, and wash her hands and go back to bed  all by herself.  We suggested to her that she probably will need skilled  nursing facility and the rehab for a few weeks, but she has declined  this.  I spoke to her son and to her at length, and they are still  adamant that she will be able to do well at home.  The son assures me  that there will be somebody at this time with this patient for 24-hour  supervision, which is what we recommend.  She says she has no complaint  whatsoever today, no chest pain, no shortness of breath, no abdominal  pain, no nausea, no vomiting.  She is eating well.   PHYSICAL EXAMINATION:  VITAL SIGNS:  Today, temperature 98.8, pulse 92,  respiration 18, blood pressure 120/84, saturating 95% on room air.  CHEST:  Clear to auscultation bilaterally.  No rhonchi, no rales, no  wheezing.  ABDOMEN:  Soft, nontender.  EXTREMITIES:  No clubbing, no cyanosis, no edema.  CARDIOVASCULAR:  First and second heart sounds only.  CENTRAL NERVOUS SYSTEM:  Nonfocal.   The patient is to follow up with Dr. Pecola Leisure, her primary care physician,  in 1 week and she is to call for this appointment, and the son is also  aware of this.  The patient is also to follow up with [facility]gery either Dr. Johna Sheriff, Dr. Donell Beers, or Dr. Ezzard Standing.  Phone number  has been provided in the instruction paper is [phone] and she is to  call and make this appointment to see them in 3-4 weeks.  We recommend  to have home health for  physical therapy, occupational therapy, and a  registered nurse.  We also recommend a walker and tub seat with back.   DISCHARGE MEDICATIONS:  Minimal and they include:  1. Protonix 40  mg p.o. daily.  2. Zofran 4 mg p.o. q.6 h. p.r.n. for nausea and vomiting.  3. The patient is to have Resource Nutritional Supplement 240 mL      t.i.d. with meals.   The patient has not been requiring pain medications in the last few  days, so we are not going to discharge her on any pain medications.   Time used for discharge plan is greater than 30 minutes.      Ruthy Dick, MD  Electronically Signed     Ruthy Dick, MD  Electronically Signed    GU/MEDQ  D:  07/25/2008  T:  07/26/2008  Job:  [id number]   cc:   [facility]  Betti D. Pecola Leisure, M.D.

## 2010-06-19 NOTE — Discharge Summary (Signed)
[name], [name]                ACCOUNT NO.:  [id number]   MEDICAL RECORD NO.:  [id number]          PATIENT TYPE:  INP   LOCATION:  6736                         FACILITY:  [facility]   PHYSICIAN:  Monte Fantasia, MD  DATE OF BIRTH:  12-Feb-1940   DATE OF ADMISSION:  08/28/2008  DATE OF DISCHARGE:  08/31/2008                               DISCHARGE SUMMARY   PRIMARY CARE PHYSICIAN:  Unassigned.   DISCHARGE DIAGNOSIS:  1. Acute on chronic pancreatitis.  2. Cholelithiasis.  3. Hypokalemia which is resolved  4. Hypomagnesemia, resolved.   DISCHARGE MEDICATIONS:  1. Percocet 5/325 two tablets p.o. q.6 h. p.r.n. pain.  2. Colace 300 mg p.o. nightly.  3. Protonix 40 mg p.o. daily  4. Magnesium oxide 400 mg p.o. t.i.d. for 3 days.   COURSE DURING THE HOSPITAL STAY:  [name] is a 70 year old lady  patient was admitted on August 28, 2008, with complaints of abdominal  pain, nausea, and vomiting.  The patient had not had been recently  discharged from [facility] last month with severe pancreatitis with  sepsis, cholelithiasis, and gallstone-induced pancreatitis.  The patient  improved well through the stay.  Initially, on admission, the patient  was kept n.p.o. with IV fluids and was given pain management.  The  patient improved well through the stay in the hospital, gradually was  transitioned to clear liquids, and now is on full diet.  The patient has  tolerated full diet well.  Initially, on admission, the patient was  started on IV antibiotics for possible sepsis, however, that was ruled  out and antibiotics was discontinued.  The patient is symptomatically  much better and tolerating diet well.  At present, the patient is  medically stable to be discharged and can be discharged home.   RADIOLOGICAL INVESTIGATIONS DONE DURING THE STAY IN THE HOSPITAL:  1. Chest x-ray done on August 28, 2008:  Impression:  Cardiomegaly with      vascular congestion, bibasilar atelectasis.  No  significant      interval change.  2. CT of abdomen with contrast:  Impression:  Marked reduction in      acute inflammatory changes, resolution of left-sided effusion, mild      basilar atelectasis, scarring versus pseudocyst, largest in the      pancreatic tail measuring 9.4 x 5.7 x 9.6, smallest pseudocyst of      3.6 x 2.7 x 3.4 of the pancreatic head, some diffuse inflammatory      changes, persist throughout the mesentery.  3. CT of pelvis:  No significant finding in the pelvis.   LABORATORY DATA DONE DURING THE STAY IN THE HOSPITAL:  Total WBC 5.7,  hemoglobin 11.1, hematocrit 33.0, and platelets of 223.  Sodium 145,  potassium 3.4, chloride 109, bicarb 29, glucose 115, BUN less than 1,  creatinine 0.52, total bilirubin 0.5, indirect 0.4, direct 0.1, alkaline  phosphatase 32, AST 20, ALT 8, total protein 4.2, albumin 1.9, calcium  8.9, phosphorus 4.2, magnesium 1.8, amylase 178, lipase 51.  Cardiac  enzymes x2 were negative.  Blood cultures have been no  growth to date.   DISPOSITION:  The patient is at present medically stable to be  discharged and can be discharged home today.  The patient is concerned  as she has not moved her bowels for the past 3-4 days.  However, the  patient had been n.p.o. and then on liquid diet prior to that.  The  patient has been explained regarding the same.  However, Fleet Enema has  been ordered as the patient wants to go home after having a bowel  movement.  The patient is at present medically stable, to be discharged.   Total time for discharge of 40 minutes.      Monte Fantasia, MD  Electronically Signed     MP/MEDQ  D:  08/31/2008  T:  09/01/2008  Job:  [id number]

## 2010-11-15 LAB — BASIC METABOLIC PANEL
CO2: 28
Calcium: 9.3
Chloride: 107
Glucose, Bld: 88
Sodium: 142

## 2010-11-15 LAB — DIFFERENTIAL
Basophils Absolute: 0
Basophils Relative: 0
Eosinophils Absolute: 0.1
Eosinophils Relative: 1
Monocytes Absolute: 0.5

## 2010-11-15 LAB — URINALYSIS, ROUTINE W REFLEX MICROSCOPIC
Bilirubin Urine: NEGATIVE
Ketones, ur: NEGATIVE
Leukocytes, UA: NEGATIVE
Nitrite: NEGATIVE
Protein, ur: NEGATIVE
Urobilinogen, UA: 1

## 2010-11-15 LAB — RAPID URINE DRUG SCREEN, HOSP PERFORMED
Amphetamines: NOT DETECTED
Benzodiazepines: NOT DETECTED
Cocaine: NOT DETECTED
Opiates: NOT DETECTED
Tetrahydrocannabinol: NOT DETECTED

## 2010-11-15 LAB — PROTIME-INR
INR: 1.1
Prothrombin Time: 14

## 2010-11-15 LAB — CBC
HCT: 39.9
Hemoglobin: 13.4
MCHC: 33.7
MCV: 93.8
RDW: 12.5

## 2010-11-15 LAB — URINE MICROSCOPIC-ADD ON

## 2010-12-29 IMAGING — CT CT ABDOMEN W/ CM
2 of 5 series · 17 of 46 positions shown, 19 images · IV contrast (APPLIED)
Comparison: None

CT ABDOMEN

CLINICAL DATA: Abdominal and pelvic pain with nausea vomiting.
History of breast cancer.

CT ABDOMEN AND PELVIS WITH CONTRAST
TECHNIQUE: Multidetector CT imaging of the abdomen and pelvis was
performed using the standard protocol following bolus
administration of intravenous contrast.
Contrast: 80 ml intravenous Lmnipaque-P22

[Series 2: abd/pelv with 5.0 b31f st · axial · 0.92mm/px · z∈[-610,-200]mm · 14 of 92 slices shown, 16 images]
[im 5/92  soft-tissue]
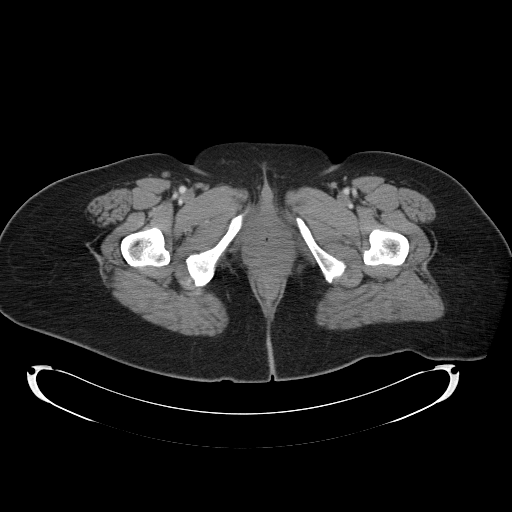
[im 5/92  bone]
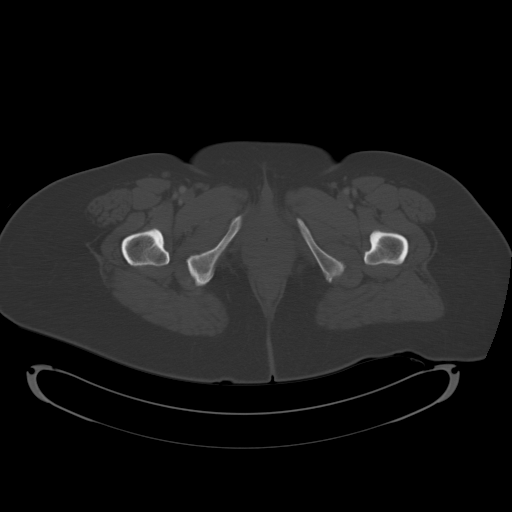
[im 10/92  soft-tissue]
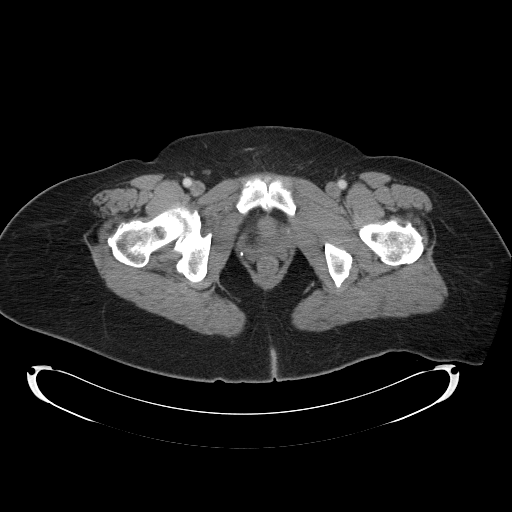
[im 20/92  soft-tissue]
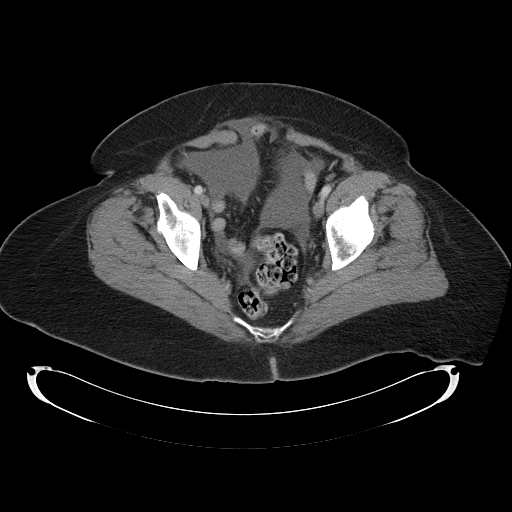
[im 24/92  soft-tissue]
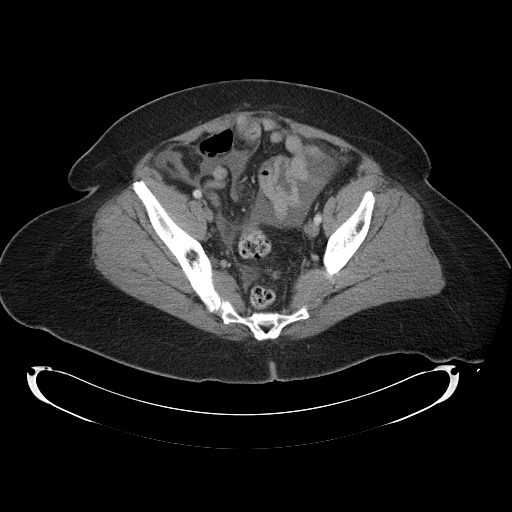
[im 29/92  soft-tissue]
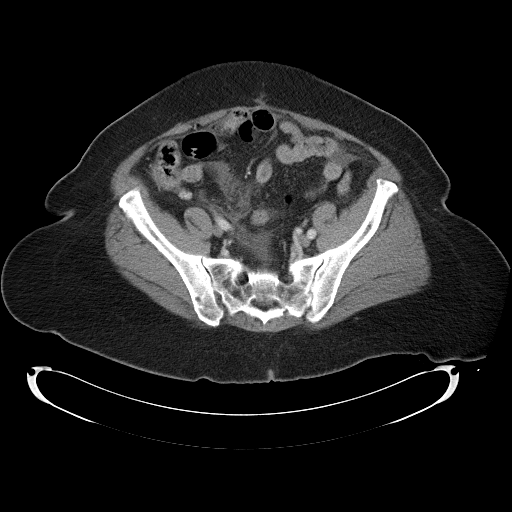
[im 39/92  soft-tissue]
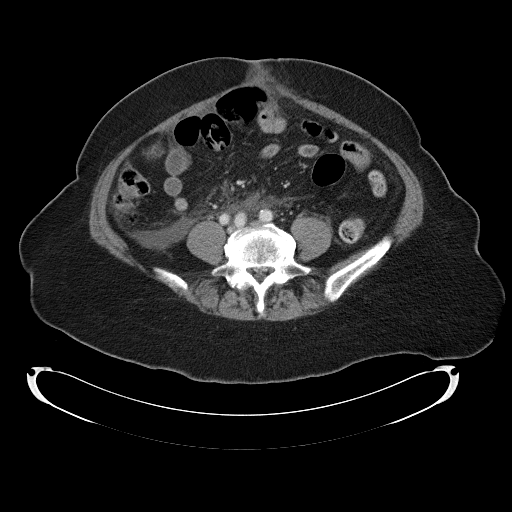
[im 44/92  soft-tissue]
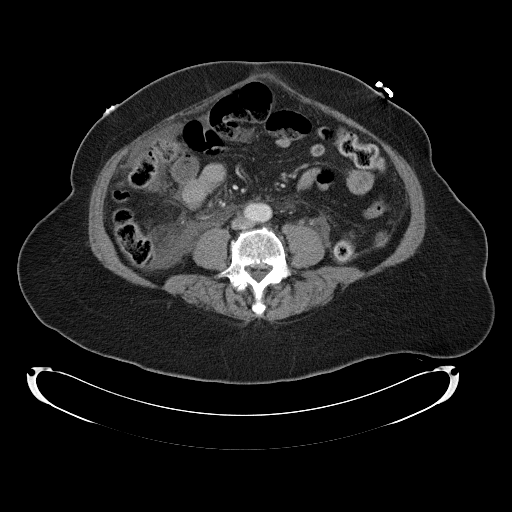
[im 48/92  soft-tissue]
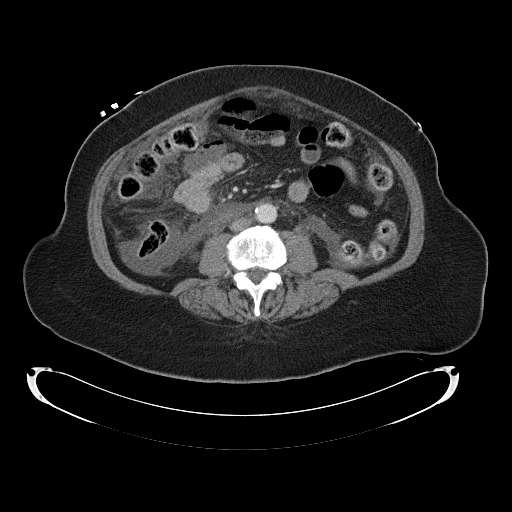
[im 53/92  soft-tissue]
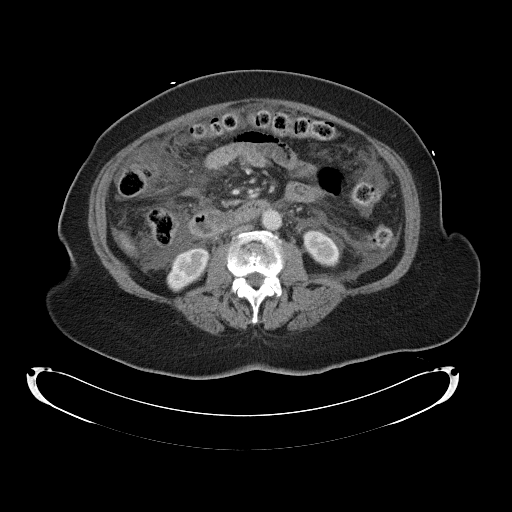
[im 53/92  bone]
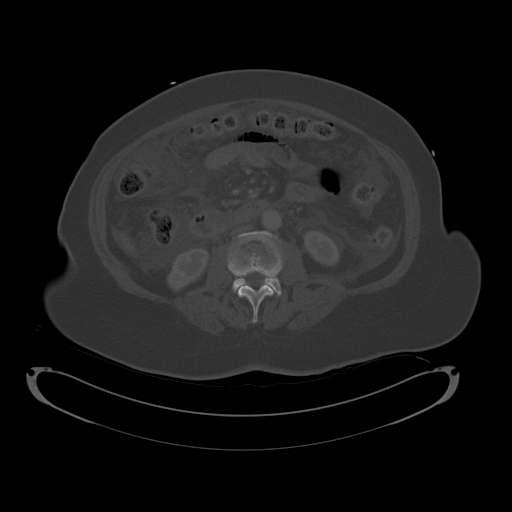
[im 63/92  soft-tissue]
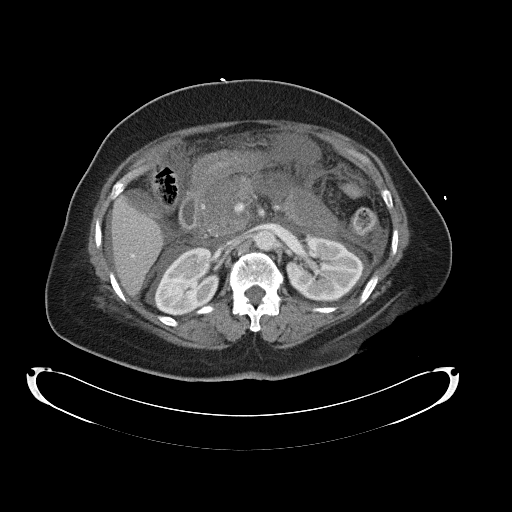
[im 68/92  soft-tissue]
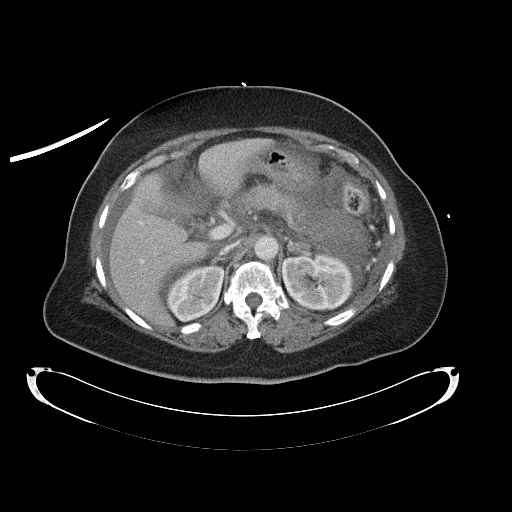
[im 72/92  soft-tissue]
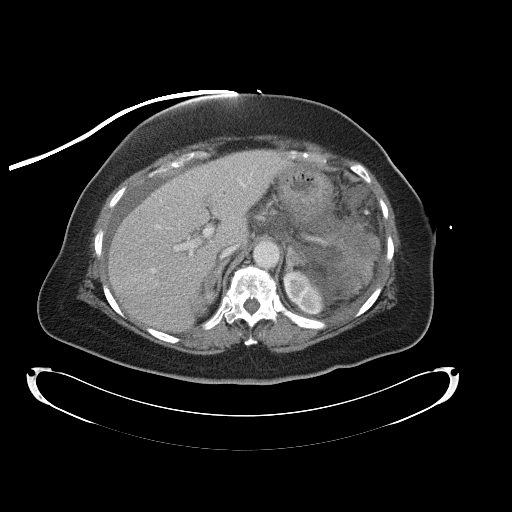
[im 82/92  soft-tissue]
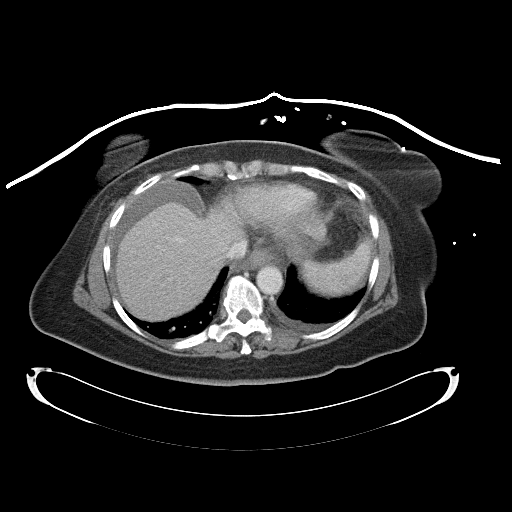
[im 87/92  soft-tissue]
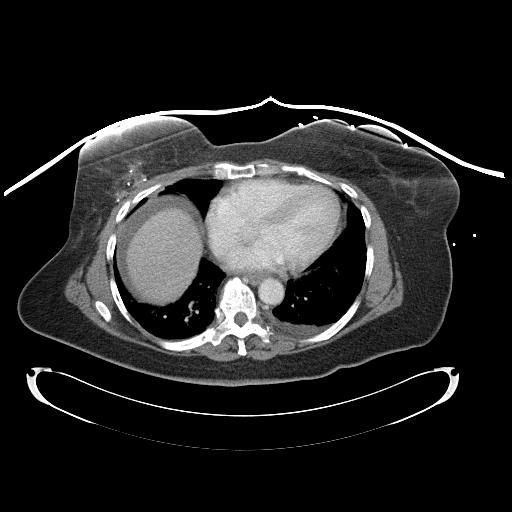

[Series 6: abd/pelv with 3.0 spo cor st · coronal · 0.89mm/px · 3 of 84 slices shown]
[im 28/84  soft-tissue]
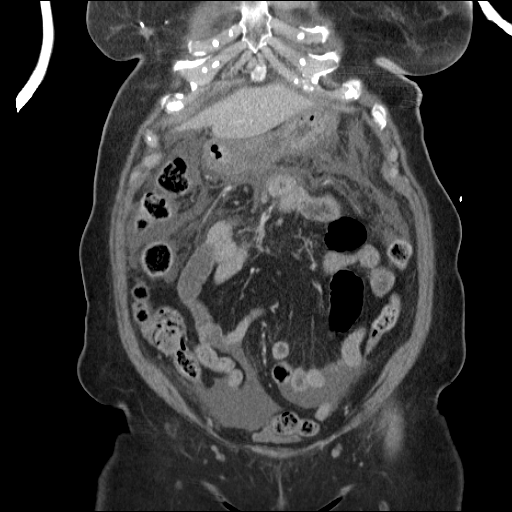
[im 37/84  soft-tissue]
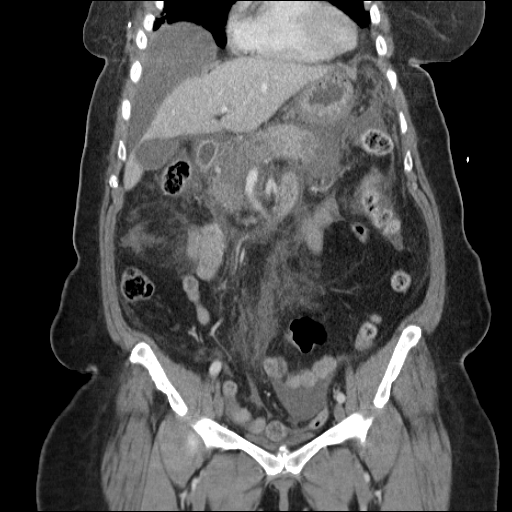
[im 47/84  soft-tissue]
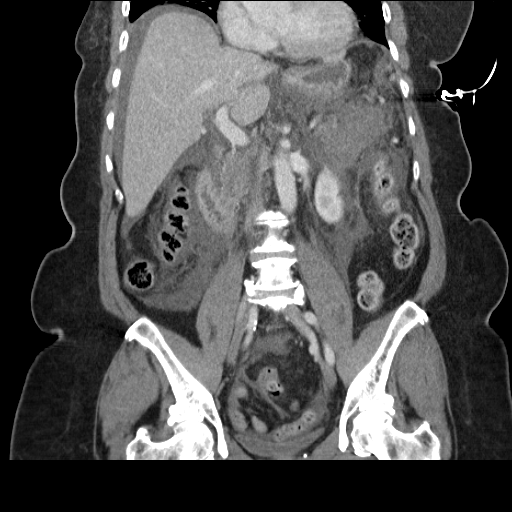

[17 of 46 positions shown; findings below may reference images not displayed]

FINDINGS: Tiny bilateral pleural effusions are identified.

Extensive peripancreatic inflammation and acute fluid collections
are identified.  This fluid extends into the lesser sac and
retroperitoneum.
There are moderate areas of decreased attenuation in the pancreas
at the body - head junction and the body - tail junction compatible
with pancreatic necrosis.
A small amount of ascites is identified.

The liver, spleen, kidneys, and adrenal glands are unremarkable.
Cholelithiasis is identified without CT evidence of cholecystitis.
The visualized bowel is grossly unremarkable.
No evidence of biliary dilatation or abdominal aortic aneurysm
noted.
IMPRESSION: Moderate to severe pancreatitis with areas of pancreatic necrosis
and moderate acute fluid collections.  Small amount of ascites and
tiny bilateral pleural effusions.

Cholelithiasis.

CT PELVIS
FINDINGS: A small to moderate amount of free fluid in the pelvis
is noted.
The visualized bowel and bladder are within normal limits.
The patient is status post hysterectomy. No acute or suspicious
bony abnormalities are noted.
IMPRESSION: Small to moderate amount of free fluid in the pelvis.

## 2010-12-29 IMAGING — CR DG CHEST 1V PORT
1 series · 1 of 1 positions shown · non-contrast
Comparison: 10/26/2006

CLINICAL DATA: Abdominal pain.

PORTABLE CHEST - 1 VIEW

[view not recorded]
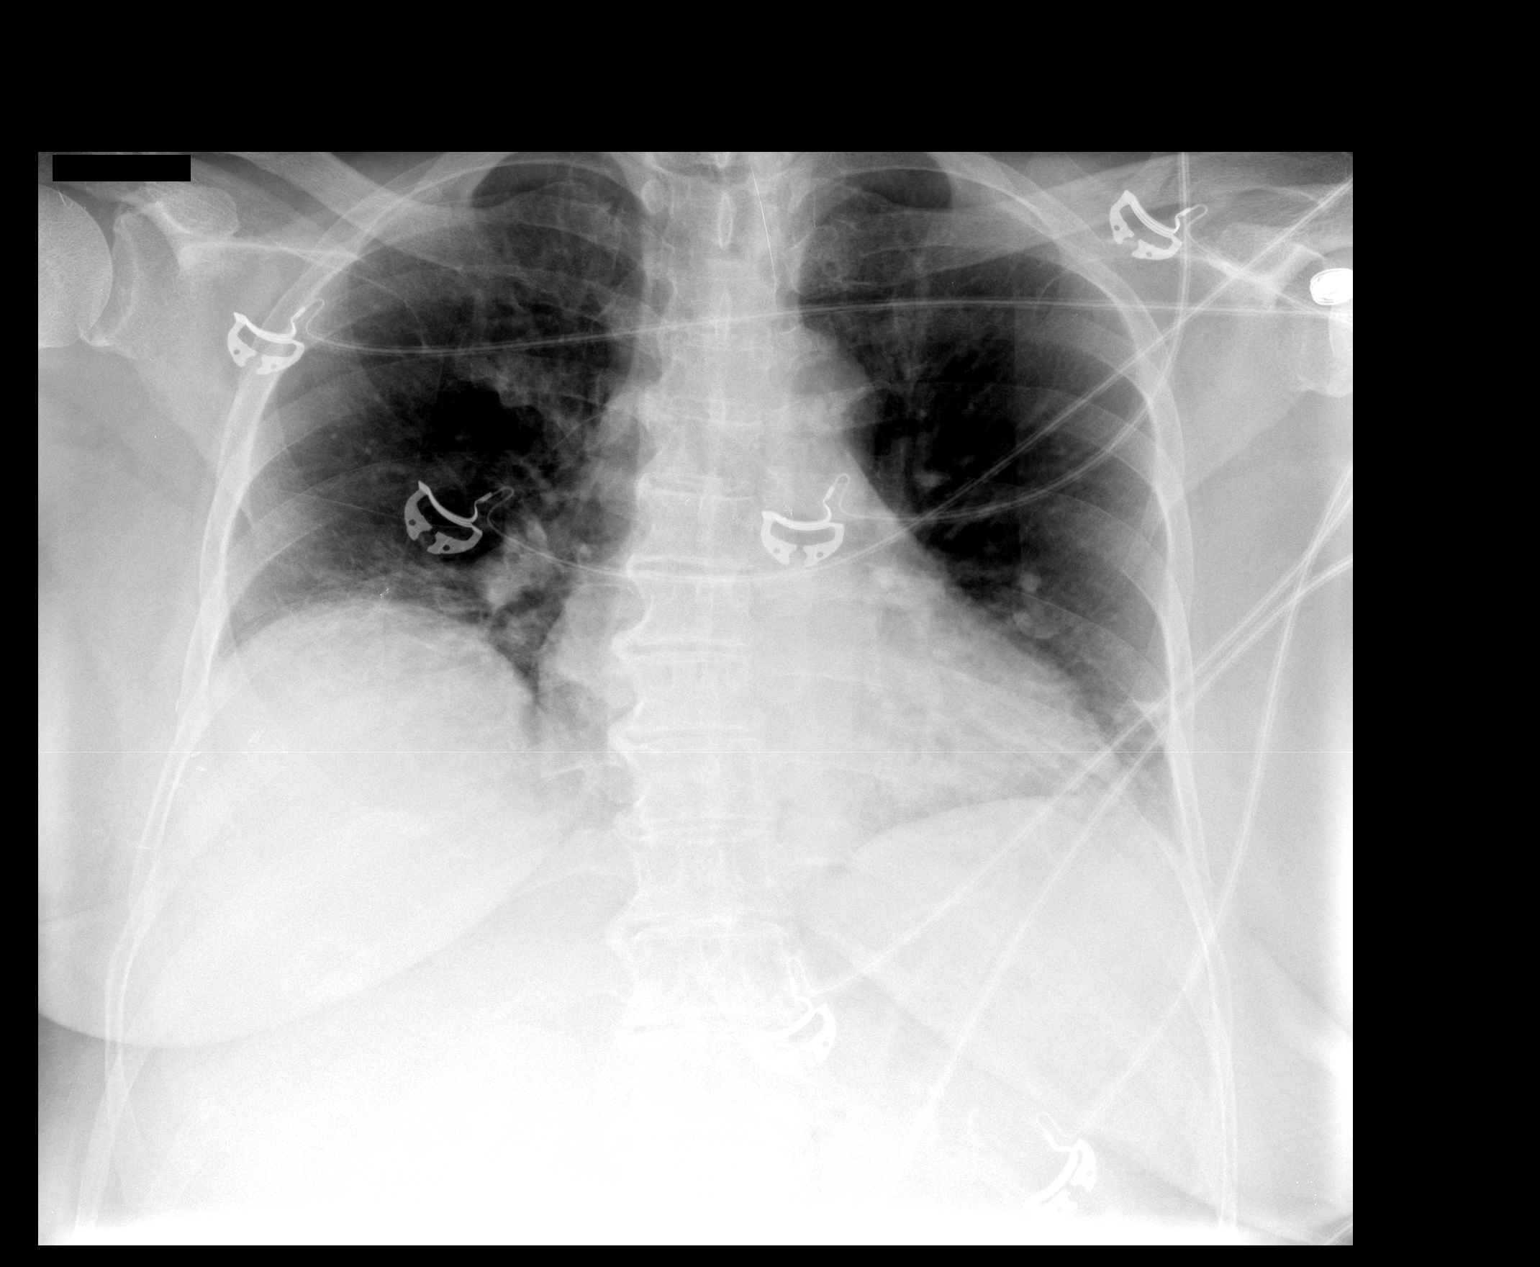

[1 of 1 positions shown; findings below may reference images not displayed]

FINDINGS: Mild cardiomegaly is again identified.
Mild bibasilar atelectasis is noted.
No evidence of pneumothorax.
Tiny bilateral pleural effusions identified on recent CT are not
well visualized.
IMPRESSION: Mild bibasilar atelectasis and mild cardiomegaly.

## 2013-04-19 ENCOUNTER — Other Ambulatory Visit: Payer: Self-pay | Admitting: Family Medicine

## 2013-04-19 DIAGNOSIS — R51 Headache: Secondary | ICD-10-CM

## 2013-04-19 DIAGNOSIS — R410 Disorientation, unspecified: Secondary | ICD-10-CM

## 2013-04-19 DIAGNOSIS — R269 Unspecified abnormalities of gait and mobility: Secondary | ICD-10-CM

## 2013-04-22 ENCOUNTER — Encounter (HOSPITAL_COMMUNITY): Payer: Self-pay | Admitting: Emergency Medicine

## 2013-04-22 ENCOUNTER — Emergency Department (HOSPITAL_COMMUNITY): Payer: Medicare HMO

## 2013-04-22 ENCOUNTER — Emergency Department (HOSPITAL_COMMUNITY)
Admission: EM | Admit: 2013-04-22 | Discharge: 2013-04-22 | Disposition: A | Discharge status: Home / Self Care | Payer: Medicare HMO | Attending: Emergency Medicine | Admitting: Emergency Medicine

## 2013-04-22 DIAGNOSIS — R5383 Other fatigue: Secondary | ICD-10-CM

## 2013-04-22 DIAGNOSIS — R51 Headache: Secondary | ICD-10-CM | POA: Insufficient documentation

## 2013-04-22 DIAGNOSIS — E119 Type 2 diabetes mellitus without complications: Secondary | ICD-10-CM | POA: Insufficient documentation

## 2013-04-22 DIAGNOSIS — R498 Other voice and resonance disorders: Secondary | ICD-10-CM | POA: Insufficient documentation

## 2013-04-22 DIAGNOSIS — H409 Unspecified glaucoma: Secondary | ICD-10-CM | POA: Insufficient documentation

## 2013-04-22 DIAGNOSIS — Z8719 Personal history of other diseases of the digestive system: Secondary | ICD-10-CM | POA: Insufficient documentation

## 2013-04-22 DIAGNOSIS — Z859 Personal history of malignant neoplasm, unspecified: Secondary | ICD-10-CM | POA: Insufficient documentation

## 2013-04-22 DIAGNOSIS — R519 Headache, unspecified: Secondary | ICD-10-CM

## 2013-04-22 DIAGNOSIS — R5381 Other malaise: Secondary | ICD-10-CM | POA: Insufficient documentation

## 2013-04-22 DIAGNOSIS — R42 Dizziness and giddiness: Secondary | ICD-10-CM

## 2013-04-22 DIAGNOSIS — H532 Diplopia: Secondary | ICD-10-CM | POA: Insufficient documentation

## 2013-04-22 HISTORY — DX: Malignant (primary) neoplasm, unspecified: C80.1

## 2013-04-22 HISTORY — DX: Type 2 diabetes mellitus without complications: E11.9

## 2013-04-22 HISTORY — DX: Unspecified glaucoma: H40.9

## 2013-04-22 HISTORY — DX: Gastro-esophageal reflux disease without esophagitis: K21.9

## 2013-04-22 HISTORY — DX: Acute pancreatitis without necrosis or infection, unspecified: K85.90

## 2013-04-22 HISTORY — DX: Pure hypercholesterolemia, unspecified: E78.00

## 2013-04-22 HISTORY — DX: Diverticulosis of large intestine without perforation or abscess without bleeding: K57.30

## 2013-04-22 LAB — CBC WITH DIFFERENTIAL/PLATELET
Basophils Absolute: 0 10*3/uL (ref 0.0–0.1)
Basophils Relative: 0 % (ref 0–1)
EOS ABS: 0.1 10*3/uL (ref 0.0–0.7)
Eosinophils Relative: 2 % (ref 0–5)
HCT: 35 % — ABNORMAL LOW (ref 36.0–46.0)
HEMOGLOBIN: 11.6 g/dL — AB (ref 12.0–15.0)
LYMPHS ABS: 2.1 10*3/uL (ref 0.7–4.0)
LYMPHS PCT: 34 % (ref 12–46)
MCH: 31 pg (ref 26.0–34.0)
MCHC: 33.1 g/dL (ref 30.0–36.0)
MCV: 93.6 fL (ref 78.0–100.0)
MONOS PCT: 9 % (ref 3–12)
Monocytes Absolute: 0.6 10*3/uL (ref 0.1–1.0)
NEUTROS PCT: 55 % (ref 43–77)
Neutro Abs: 3.3 10*3/uL (ref 1.7–7.7)
Platelets: 237 10*3/uL (ref 150–400)
RBC: 3.74 MIL/uL — AB (ref 3.87–5.11)
RDW: 12.1 % (ref 11.5–15.5)
WBC: 6.1 10*3/uL (ref 4.0–10.5)

## 2013-04-22 LAB — COMPREHENSIVE METABOLIC PANEL
ALK PHOS: 78 U/L (ref 39–117)
ALT: 10 U/L (ref 0–35)
AST: 18 U/L (ref 0–37)
Albumin: 3.6 g/dL (ref 3.5–5.2)
BILIRUBIN TOTAL: 0.4 mg/dL (ref 0.3–1.2)
BUN: 10 mg/dL (ref 6–23)
CO2: 28 meq/L (ref 19–32)
Calcium: 9.6 mg/dL (ref 8.4–10.5)
Chloride: 104 mEq/L (ref 96–112)
Creatinine, Ser: 0.65 mg/dL (ref 0.50–1.10)
GFR, EST NON AFRICAN AMERICAN: 87 mL/min — AB (ref >90)
GLUCOSE: 84 mg/dL (ref 70–99)
POTASSIUM: 4.3 meq/L (ref 3.7–5.3)
Sodium: 144 mEq/L (ref 137–147)
TOTAL PROTEIN: 7.3 g/dL (ref 6.0–8.3)

## 2013-04-22 LAB — URINALYSIS, ROUTINE W REFLEX MICROSCOPIC
Bilirubin Urine: NEGATIVE
Glucose, UA: NEGATIVE mg/dL
KETONES UR: NEGATIVE mg/dL
LEUKOCYTES UA: NEGATIVE
NITRITE: NEGATIVE
PH: 7 (ref 5.0–8.0)
PROTEIN: NEGATIVE mg/dL
Specific Gravity, Urine: 1.008 (ref 1.005–1.030)
UROBILINOGEN UA: 0.2 mg/dL (ref 0.0–1.0)

## 2013-04-22 LAB — CBG MONITORING, ED
GLUCOSE-CAPILLARY: 93 mg/dL (ref 70–99)
Glucose-Capillary: 78 mg/dL (ref 70–99)

## 2013-04-22 LAB — URINE MICROSCOPIC-ADD ON

## 2013-04-22 MED ORDER — KETOROLAC TROMETHAMINE 30 MG/ML IJ SOLN
30.0000 mg | Freq: Once | INTRAMUSCULAR | Status: AC
  Administered 2013-04-22: 30 mg via INTRAVENOUS
  Filled 2013-04-22: qty 1

## 2013-04-22 MED ORDER — SODIUM CHLORIDE 0.9 % IV BOLUS (SEPSIS)
500.0000 mL | Freq: Once | INTRAVENOUS | Status: AC
  Administered 2013-04-22: 500 mL via INTRAVENOUS

## 2013-04-22 NOTE — ED Notes (Addendum)
Dizziness since jan  Worse w/ position change  H/a since Monday and  her dr  and has her scheduled for/ct MRI  Tomorrow  but family wants her sen now states that she has had  Left sided facial weakness since Monday also has had a  A cold for 2 weeks and is hoarse and is tired

## 2013-04-22 NOTE — ED Provider Notes (Signed)
CSN: [id number]     Arrival date & time 04/22/13  1422 History   First MD Initiated Contact with Patient 04/22/13 1510     Chief Complaint  Patient presents with  . Dizziness     (Consider location/radiation/quality/duration/timing/severity/associated sxs/prior Treatment) Patient is a 73 y.o. female presenting with dizziness. The history is provided by the patient and medical records. No language interpreter was used.  Dizziness Quality:  Lightheadedness Severity:  Moderate Onset quality:  Gradual Progression:  Worsening Chronicity:  Recurrent Context: standing up   Context: not with loss of consciousness   Relieved by:  Lying down Worsened by:  Standing up Associated symptoms: headaches   Headaches:    Severity:  Severe   Onset quality:  Sudden   Timing:  Constant   Progression:  Worsening   Chronicity:  New   Past Medical History  Diagnosis Date  . Diabetes mellitus without complication   . Pancreatitis   . Cancer   . High cholesterol   . Glaucoma   . Diverticula of colon   . Acid reflux    No past surgical history on file. No family history on file. History  Substance Use Topics  . Smoking status: Not on file  . Smokeless tobacco: Not on file  . Alcohol Use: Not on file   OB History   Grav Para Term Preterm Abortions TAB SAB Ect Mult Living                 Review of Systems  Constitutional: Positive for fatigue.  HENT: Positive for voice change.   Eyes: Positive for visual disturbance.  Neurological: Positive for dizziness, weakness and headaches. Negative for facial asymmetry, speech difficulty and numbness.  All other systems reviewed and are negative.      Allergies  Lipitor  Home Medications  No current outpatient prescriptions on file. BP 137/85  Pulse 56  Temp(Src) 99 F (37.2 C)  Resp 18  Wt 193 lb (87.544 kg)  SpO2 100% Physical Exam  Nursing note and vitals reviewed. Constitutional: She is oriented to person, place, and time.  She appears well-developed and well-nourished.  HENT:  Head: Normocephalic.  Eyes: Conjunctivae and EOM are normal. Pupils are equal, round, and reactive to light. Right eye exhibits no nystagmus. Left eye exhibits no nystagmus.  Patient reports diplopia.  Neck: Normal range of motion.  Cardiovascular: Normal rate and regular rhythm.   Pulmonary/Chest: Effort normal and breath sounds normal.  Abdominal: Soft. Bowel sounds are normal.  Musculoskeletal: She exhibits no edema and no tenderness.  Lymphadenopathy:    She has no cervical adenopathy.  Neurological: She is alert and oriented to person, place, and time. She has normal strength. No cranial nerve deficit. Coordination normal. GCS eye subscore is 4. GCS verbal subscore is 5. GCS motor subscore is 6.  Skin: Skin is warm and dry.  Psychiatric: She has a normal mood and affect. Her behavior is normal. Judgment and thought content normal.    ED Course  Procedures (including critical care time) Labs Review Labs Reviewed - No data to display Imaging Review No results found.   EKG Interpretation None     Patient seen by her PCP on Monday for evaluation of dizziness, headache.  Scheduled for OP MRI on Friday, 04/23/13.  Returned to PCP today after headache worsened.  ? orthostasis in ED (solitary sitting BP of 60/44, not confirmed on repeat).  Stroke screen negative.   Radiology and lab results reviewed and shared with  patient.  No acute findings on CT.  Mild anemia.  Patient feels better after fluids and medication.  Patient discussed with Dr. Dorna Mai. Intermittent dizziness since January.  Patient is scheduled for an outpatient MRI Friday morning.    Will discharge home with PCP follow-up. MDM   Final diagnoses:  None    Dizziness.    Norman Herrlich, NP 04/23/13 [phone]

## 2013-04-22 NOTE — Discharge Instructions (Signed)
Dizziness Dizziness is a common problem. It is a feeling of unsteadiness or lightheadedness. You may feel like you are about to faint. Dizziness can lead to injury if you stumble or fall. A person of any age group can suffer from dizziness, but dizziness is more common in older adults. CAUSES  Dizziness can be caused by many different things, including:  Middle ear problems.  Standing for too long.  Infections.  An allergic reaction.  Aging.  An emotional response to something, such as the sight of blood.  Side effects of medicines.  Fatigue.  Problems with circulation or blood pressure.  Excess use of alcohol, medicines, or illegal drug use.  Breathing too fast (hyperventilation).  An arrhythmia or problems with your heart rhythm.  Low red blood cell count (anemia).  Pregnancy.  Vomiting, diarrhea, fever, or other illnesses that cause dehydration.  Diseases or conditions such as Parkinson's disease, high blood pressure (hypertension), diabetes, and thyroid problems.  Exposure to extreme heat. DIAGNOSIS  To find the cause of your dizziness, your caregiver may do a physical exam, lab tests, radiologic imaging scans, or an electrocardiography test (ECG).  TREATMENT  Treatment of dizziness depends on the cause of your symptoms and can vary greatly. HOME CARE INSTRUCTIONS   Drink enough fluids to keep your urine clear or pale yellow. This is especially important in very hot weather. In the elderly, it is also important in cold weather.  If your dizziness is caused by medicines, take them exactly as directed. When taking blood pressure medicines, it is especially important to get up slowly.  Rise slowly from chairs and steady yourself until you feel okay.  In the morning, first sit up on the side of the bed. When this seems okay, stand slowly while holding onto something until you know your balance is fine.  If you need to stand in one place for a long time, be sure to  move your legs often. Tighten and relax the muscles in your legs while standing.  If dizziness continues to be a problem, have someone stay with you for a day or two. Do this until you feel you are well enough to stay alone. Have the person call your caregiver if he or she notices changes in you that are concerning.  Do not drive or use heavy machinery if you feel dizzy.  Do not drink alcohol. SEEK IMMEDIATE MEDICAL CARE IF:   Your dizziness or lightheadedness gets worse.  You feel nauseous or vomit.  You develop problems with talking, walking, weakness, or using your arms, hands, or legs.  You are not thinking clearly or you have difficulty forming sentences. It may take a friend or family member to determine if your thinking is normal.  You develop chest pain, abdominal pain, shortness of breath, or sweating.  Your vision changes.  You notice any bleeding.  You have side effects from medicine that seems to be getting worse rather than better. MAKE SURE YOU:   Understand these instructions.  Will watch your condition.  Will get help right away if you are not doing well or get worse. Document Released: 07/17/2000 Document Revised: 04/15/2011 Document Reviewed: 08/10/2010 [facility] Patient Information 2014 [place], [place].  PLEASE COMPLETE YOUR OUTPATIENT MRI TOMORROW AS SCHEDULED.

## 2013-04-23 ENCOUNTER — Ambulatory Visit
Admission: RE | Admit: 2013-04-23 | Discharge: 2013-04-23 | Disposition: A | Discharge status: Home / Self Care | Payer: Commercial Managed Care - HMO | Source: Ambulatory Visit | Attending: Family Medicine | Admitting: Family Medicine

## 2013-04-23 DIAGNOSIS — R51 Headache: Secondary | ICD-10-CM

## 2013-04-23 DIAGNOSIS — R410 Disorientation, unspecified: Secondary | ICD-10-CM

## 2013-04-23 DIAGNOSIS — R269 Unspecified abnormalities of gait and mobility: Secondary | ICD-10-CM

## 2013-04-29 ENCOUNTER — Ambulatory Visit: Payer: Medicare HMO | Attending: Family Medicine | Admitting: Physical Therapy

## 2013-04-29 ENCOUNTER — Ambulatory Visit: Payer: Medicare HMO | Admitting: Physical Therapy

## 2013-04-29 DIAGNOSIS — IMO0001 Reserved for inherently not codable concepts without codable children: Secondary | ICD-10-CM | POA: Diagnosis not present

## 2013-04-29 DIAGNOSIS — R269 Unspecified abnormalities of gait and mobility: Secondary | ICD-10-CM | POA: Diagnosis not present

## 2013-04-29 DIAGNOSIS — H8110 Benign paroxysmal vertigo, unspecified ear: Secondary | ICD-10-CM | POA: Insufficient documentation

## 2013-05-01 NOTE — ED Provider Notes (Signed)
Medical screening examination/treatment/procedure(s) were performed by non-physician practitioner and as supervising physician I was immediately available for consultation/collaboration.   EKG Interpretation None        Saddie Benders. Zayah Keilman, MD 05/01/13 0200

## 2013-05-04 ENCOUNTER — Ambulatory Visit: Payer: Medicare HMO | Admitting: Physical Therapy

## 2013-05-04 DIAGNOSIS — IMO0001 Reserved for inherently not codable concepts without codable children: Secondary | ICD-10-CM | POA: Diagnosis not present

## 2013-05-17 ENCOUNTER — Ambulatory Visit: Payer: Medicare HMO | Admitting: Physical Therapy

## 2013-05-20 ENCOUNTER — Ambulatory Visit: Payer: Medicare HMO | Attending: Family Medicine | Admitting: Physical Therapy

## 2013-05-20 DIAGNOSIS — R269 Unspecified abnormalities of gait and mobility: Secondary | ICD-10-CM | POA: Insufficient documentation

## 2013-05-20 DIAGNOSIS — IMO0001 Reserved for inherently not codable concepts without codable children: Secondary | ICD-10-CM | POA: Insufficient documentation

## 2013-05-20 DIAGNOSIS — H8110 Benign paroxysmal vertigo, unspecified ear: Secondary | ICD-10-CM | POA: Insufficient documentation

## 2013-06-14 ENCOUNTER — Other Ambulatory Visit: Payer: Self-pay | Admitting: Gastroenterology
